# Patient Record
Sex: Male | Born: 1935 | Race: Black or African American | Hispanic: No | Marital: Married | Smoking: Former smoker
Health system: Southern US, Community
[De-identification: ages and names within clinical notes are randomized; demographics above are authoritative.]

## PROBLEM LIST (undated history)

## (undated) DIAGNOSIS — I2510 Atherosclerotic heart disease of native coronary artery without angina pectoris: Secondary | ICD-10-CM

## (undated) DIAGNOSIS — Z955 Presence of coronary angioplasty implant and graft: Secondary | ICD-10-CM

## (undated) DIAGNOSIS — I10 Essential (primary) hypertension: Secondary | ICD-10-CM

## (undated) DIAGNOSIS — F1010 Alcohol abuse, uncomplicated: Secondary | ICD-10-CM

## (undated) DIAGNOSIS — R55 Syncope and collapse: Secondary | ICD-10-CM

## (undated) DIAGNOSIS — C801 Malignant (primary) neoplasm, unspecified: Secondary | ICD-10-CM

## (undated) HISTORY — PX: CORONARY STENT PLACEMENT: SHX1402

## (undated) HISTORY — PX: BACK SURGERY: SHX140

---

## 2017-06-01 ENCOUNTER — Observation Stay (HOSPITAL_COMMUNITY)
Admission: EM | Admit: 2017-06-01 | Discharge: 2017-06-03 | Disposition: A | Discharge status: Home / Self Care | Payer: Medicare HMO | Attending: Internal Medicine | Admitting: Internal Medicine

## 2017-06-01 ENCOUNTER — Encounter (HOSPITAL_COMMUNITY): Payer: Self-pay | Admitting: Neurology

## 2017-06-01 ENCOUNTER — Emergency Department (HOSPITAL_COMMUNITY): Payer: Medicare HMO

## 2017-06-01 DIAGNOSIS — R739 Hyperglycemia, unspecified: Secondary | ICD-10-CM | POA: Diagnosis not present

## 2017-06-01 DIAGNOSIS — Z7902 Long term (current) use of antithrombotics/antiplatelets: Secondary | ICD-10-CM | POA: Diagnosis not present

## 2017-06-01 DIAGNOSIS — I700 Atherosclerosis of aorta: Secondary | ICD-10-CM | POA: Insufficient documentation

## 2017-06-01 DIAGNOSIS — I10 Essential (primary) hypertension: Secondary | ICD-10-CM | POA: Insufficient documentation

## 2017-06-01 DIAGNOSIS — E785 Hyperlipidemia, unspecified: Secondary | ICD-10-CM

## 2017-06-01 DIAGNOSIS — F1010 Alcohol abuse, uncomplicated: Secondary | ICD-10-CM

## 2017-06-01 DIAGNOSIS — I2510 Atherosclerotic heart disease of native coronary artery without angina pectoris: Secondary | ICD-10-CM | POA: Insufficient documentation

## 2017-06-01 DIAGNOSIS — Z87891 Personal history of nicotine dependence: Secondary | ICD-10-CM | POA: Diagnosis not present

## 2017-06-01 DIAGNOSIS — Z955 Presence of coronary angioplasty implant and graft: Secondary | ICD-10-CM | POA: Insufficient documentation

## 2017-06-01 DIAGNOSIS — W19XXXA Unspecified fall, initial encounter: Secondary | ICD-10-CM | POA: Insufficient documentation

## 2017-06-01 DIAGNOSIS — S0003XA Contusion of scalp, initial encounter: Secondary | ICD-10-CM | POA: Insufficient documentation

## 2017-06-01 DIAGNOSIS — Z880 Allergy status to penicillin: Secondary | ICD-10-CM | POA: Insufficient documentation

## 2017-06-01 DIAGNOSIS — N289 Disorder of kidney and ureter, unspecified: Secondary | ICD-10-CM | POA: Diagnosis not present

## 2017-06-01 DIAGNOSIS — R55 Syncope and collapse: Principal | ICD-10-CM | POA: Insufficient documentation

## 2017-06-01 DIAGNOSIS — S0990XA Unspecified injury of head, initial encounter: Secondary | ICD-10-CM

## 2017-06-01 DIAGNOSIS — R509 Fever, unspecified: Secondary | ICD-10-CM | POA: Insufficient documentation

## 2017-06-01 DIAGNOSIS — Z7982 Long term (current) use of aspirin: Secondary | ICD-10-CM | POA: Insufficient documentation

## 2017-06-01 DIAGNOSIS — E871 Hypo-osmolality and hyponatremia: Secondary | ICD-10-CM

## 2017-06-01 DIAGNOSIS — M79641 Pain in right hand: Secondary | ICD-10-CM | POA: Diagnosis not present

## 2017-06-01 DIAGNOSIS — S0512XA Contusion of eyeball and orbital tissues, left eye, initial encounter: Secondary | ICD-10-CM | POA: Insufficient documentation

## 2017-06-01 HISTORY — DX: Syncope and collapse: R55

## 2017-06-01 HISTORY — DX: Essential (primary) hypertension: I10

## 2017-06-01 HISTORY — DX: Atherosclerotic heart disease of native coronary artery without angina pectoris: I25.10

## 2017-06-01 HISTORY — DX: Presence of coronary angioplasty implant and graft: Z95.5

## 2017-06-01 HISTORY — DX: Malignant (primary) neoplasm, unspecified: C80.1

## 2017-06-01 HISTORY — DX: Alcohol abuse, uncomplicated: F10.10

## 2017-06-01 LAB — CBC
HCT: 34.6 % — ABNORMAL LOW (ref 39.0–52.0)
Hemoglobin: 11.3 g/dL — ABNORMAL LOW (ref 13.0–17.0)
MCH: 30.4 pg (ref 26.0–34.0)
MCHC: 32.7 g/dL (ref 30.0–36.0)
MCV: 93 fL (ref 78.0–100.0)
Platelets: 210 10*3/uL (ref 150–400)
RBC: 3.72 MIL/uL — ABNORMAL LOW (ref 4.22–5.81)
RDW: 13.1 % (ref 11.5–15.5)
WBC: 6 10*3/uL (ref 4.0–10.5)

## 2017-06-01 LAB — BASIC METABOLIC PANEL
Anion gap: 8 (ref 5–15)
BUN: 30 mg/dL — AB (ref 6–20)
CHLORIDE: 99 mmol/L — AB (ref 101–111)
CO2: 23 mmol/L (ref 22–32)
Calcium: 8.8 mg/dL — ABNORMAL LOW (ref 8.9–10.3)
Creatinine, Ser: 1.79 mg/dL — ABNORMAL HIGH (ref 0.61–1.24)
GFR calc Af Amer: 39 mL/min — ABNORMAL LOW (ref >60)
GFR calc non Af Amer: 34 mL/min — ABNORMAL LOW (ref >60)
GLUCOSE: 133 mg/dL — AB (ref 65–99)
POTASSIUM: 4.2 mmol/L (ref 3.5–5.1)
Sodium: 130 mmol/L — ABNORMAL LOW (ref 135–145)

## 2017-06-01 LAB — I-STAT TROPONIN, ED: Troponin i, poc: 0.03 ng/mL (ref 0.00–0.08)

## 2017-06-01 LAB — GLUCOSE, CAPILLARY: Glucose-Capillary: 121 mg/dL — ABNORMAL HIGH (ref 65–99)

## 2017-06-01 LAB — URINALYSIS, ROUTINE W REFLEX MICROSCOPIC
BILIRUBIN URINE: NEGATIVE
GLUCOSE, UA: NEGATIVE mg/dL
Hgb urine dipstick: NEGATIVE
KETONES UR: NEGATIVE mg/dL
Leukocytes, UA: NEGATIVE
Nitrite: NEGATIVE
PH: 5 (ref 5.0–8.0)
Protein, ur: NEGATIVE mg/dL
Specific Gravity, Urine: 1.012 (ref 1.005–1.030)

## 2017-06-01 LAB — CBG MONITORING, ED: Glucose-Capillary: 121 mg/dL — ABNORMAL HIGH (ref 65–99)

## 2017-06-01 LAB — TROPONIN I: Troponin I: 0.03 ng/mL (ref <0.03)

## 2017-06-01 MED ORDER — ATORVASTATIN CALCIUM 40 MG PO TABS
40.0000 mg | ORAL_TABLET | Freq: Every day | ORAL | Status: DC
  Administered 2017-06-02 – 2017-06-03 (×2): 40 mg via ORAL
  Filled 2017-06-01 (×2): qty 1

## 2017-06-01 MED ORDER — FERROUS SULFATE 325 (65 FE) MG PO TABS
325.0000 mg | ORAL_TABLET | Freq: Every day | ORAL | Status: DC
Start: 2017-06-02 — End: 2017-06-03
  Administered 2017-06-02 – 2017-06-03 (×2): 325 mg via ORAL
  Filled 2017-06-01 (×2): qty 1

## 2017-06-01 MED ORDER — TICAGRELOR 90 MG PO TABS
90.0000 mg | ORAL_TABLET | Freq: Every day | ORAL | Status: DC
  Administered 2017-06-02 – 2017-06-03 (×2): 90 mg via ORAL
  Filled 2017-06-01 (×2): qty 1

## 2017-06-01 MED ORDER — CHLORTHALIDONE 25 MG PO TABS
25.0000 mg | ORAL_TABLET | Freq: Every day | ORAL | Status: DC
  Administered 2017-06-02: 25 mg via ORAL
  Filled 2017-06-01: qty 1

## 2017-06-01 MED ORDER — ENOXAPARIN SODIUM 40 MG/0.4ML ~~LOC~~ SOLN
40.0000 mg | SUBCUTANEOUS | Status: DC
  Administered 2017-06-02: 40 mg via SUBCUTANEOUS
  Filled 2017-06-01: qty 0.4

## 2017-06-01 MED ORDER — ACETAMINOPHEN 650 MG RE SUPP: 650.0000 mg | Freq: Four times a day (QID) | RECTAL | Status: DC | PRN

## 2017-06-01 MED ORDER — SODIUM CHLORIDE 0.9% FLUSH
3.0000 mL | Freq: Two times a day (BID) | INTRAVENOUS | Status: DC
  Administered 2017-06-01 – 2017-06-02 (×3): 3 mL via INTRAVENOUS

## 2017-06-01 MED ORDER — SODIUM CHLORIDE 0.9 % IV SOLN
INTRAVENOUS | Status: DC
  Administered 2017-06-01: 18:00:00 via INTRAVENOUS

## 2017-06-01 MED ORDER — ASPIRIN EC 81 MG PO TBEC
81.0000 mg | DELAYED_RELEASE_TABLET | Freq: Every day | ORAL | Status: DC
  Administered 2017-06-02 – 2017-06-03 (×2): 81 mg via ORAL
  Filled 2017-06-01 (×2): qty 1

## 2017-06-01 MED ORDER — TETANUS-DIPHTH-ACELL PERTUSSIS 5-2.5-18.5 LF-MCG/0.5 IM SUSP
0.5000 mL | Freq: Once | INTRAMUSCULAR | Status: AC
  Administered 2017-06-01: 0.5 mL via INTRAMUSCULAR
  Filled 2017-06-01: qty 0.5

## 2017-06-01 MED ORDER — ONDANSETRON HCL 4 MG PO TABS: 4.0000 mg | ORAL_TABLET | Freq: Four times a day (QID) | ORAL | Status: DC | PRN

## 2017-06-01 MED ORDER — PANTOPRAZOLE SODIUM 20 MG PO TBEC
20.0000 mg | DELAYED_RELEASE_TABLET | Freq: Every day | ORAL | Status: DC
  Administered 2017-06-02 – 2017-06-03 (×2): 20 mg via ORAL
  Filled 2017-06-01 (×2): qty 1

## 2017-06-01 MED ORDER — ONDANSETRON HCL 4 MG/2ML IJ SOLN: 4.0000 mg | Freq: Four times a day (QID) | INTRAMUSCULAR | Status: DC | PRN

## 2017-06-01 MED ORDER — ACETAMINOPHEN 325 MG PO TABS
650.0000 mg | ORAL_TABLET | Freq: Four times a day (QID) | ORAL | Status: DC | PRN
  Administered 2017-06-01 (×2): 650 mg via ORAL
  Filled 2017-06-01 (×2): qty 2

## 2017-06-01 NOTE — Progress Notes (Signed)
Attending MD note  Patient was seen, examined,treatment plan was discussed with the  Advance Practice Provider.  I have personally reviewed the clinical findings, lab,EKG, imaging studies and management of this patient in detail.I have also reviewed the orders written for this patient which were under my direction. I agree with the documentation, as recorded by the Advance Practice Provider.   Ricardo Jackson is a 83 y.o. male who presents with syncopal episode of unknown etiology. Patient notes a history of 3-4 other syncopal episodes over his lifetime. These all seem to happen under different circumstances. Patient is not appear overly drunk though he does drink a significant amount. This may be contributing though episode seems to be more vagal related versus arrhythmia. Doubt infectious etiology or seizure. Patient will be placed onCIWA protocol and given IV replenishment for his nutritional/metabolic deficiencies. Renal insufficiency noted and patient will follow-up with his primary care physician. CT scan reviewed personally and without acute hemorrhage but does show previous strokes.     Ricardo Jackson, Grayling Congress, MD Family Medicine See Amion for pager # [facility]

## 2017-06-01 NOTE — ED Notes (Signed)
Patient transported to CT 

## 2017-06-01 NOTE — ED Provider Notes (Signed)
[place] DEPT Provider Note   CSN: [id number] Arrival date & time: 06/01/17  5638     History   Chief Complaint Chief Complaint  Patient presents with  . Fall  . Loss of Consciousness    HPI Ricardo Jackson is a 83 y.o. male.  Patient from the [place] area. All his providers are up in that area. He was down here to play golf. Patient does vehicle plague off had syncopal episode felt nausea just prior to it. No vomiting. EMS arrived and he passed out again. Patient denied any other symptoms other than when he passed out the first time he had his for head on the pavement has swelling and bleeding from there. Also some bruising around his left eye. In complaint of pain to his right hand. Tetanus is not up-to-date. Patient denied any shortness of breath chest pain headache being dizzy denies any speech problems any weakness anywhere. Denies any neck or back pain.      Past Medical History:  Diagnosis Date  . Cancer ([place])   . History of heart artery stent   . Hypertension     There are no active problems to display for this patient.   Past Surgical History:  Procedure Laterality Date  . BACK SURGERY         Home Medications    Prior to Admission medications   Not on File    Family History No family history on file.  Social History Social History  Substance Use Topics  . Smoking status: Former Research scientist (life sciences)  . Smokeless tobacco: Never Used  . Alcohol use Yes     Allergies   Penicillins   Review of Systems Review of Systems  Constitutional: Negative for fever.  HENT: Positive for facial swelling. Negative for congestion and trouble swallowing.   Eyes: Negative for photophobia.  Respiratory: Negative for shortness of breath.   Cardiovascular: Negative for chest pain.  Gastrointestinal: Negative for abdominal pain.  Genitourinary: Negative for dysuria.  Musculoskeletal: Negative for back pain and neck pain.  Skin: Positive for wound. Negative for rash.    Neurological: Positive for syncope.  Hematological: Does not bruise/bleed easily.  Psychiatric/Behavioral: Negative for confusion.     Physical Exam Updated Vital Signs BP (!) 143/84 (BP Location: Right Arm)   Pulse 66   Temp 98 F (36.7 C) (Oral)   Resp (!) 24   Ht 1.753 m (5\' 9" )   Wt 70.3 kg (155 lb)   SpO2 97%   BMI 22.89 kg/m   Physical Exam  Constitutional: He is oriented to person, place, and time. He appears well-developed and well-nourished. No distress.  HENT:  Head: Normocephalic.  Mouth/Throat: Oropharynx is clear and moist.  Hematoma to the left for head with about a 1 cm abrasion superficial laceration. Some blood around the left nares. Bruising to the inferior lateral aspect of the left eye soft tissue.  Eyes: Conjunctivae and EOM are normal. Pupils are equal, round, and reactive to light.  Neck: Normal range of motion. Neck supple. No thyromegaly present.  Cardiovascular: Normal rate, regular rhythm and normal heart sounds.   Pulmonary/Chest: Effort normal and breath sounds normal. No respiratory distress.  Abdominal: Soft. Bowel sounds are normal. He exhibits no distension.  Musculoskeletal: Normal range of motion. He exhibits tenderness.  Tenderness and, swelling to the right wrist no snuffbox tenderness.  Neurological: He is alert and oriented to person, place, and time. No cranial nerve deficit or sensory deficit. He exhibits normal muscle tone.  Coordination normal.  Skin: Skin is warm.  Nursing note and vitals reviewed.    ED Treatments / Results  Labs (all labs ordered are listed, but only abnormal results are displayed) Labs Reviewed  BASIC METABOLIC PANEL - Abnormal; Notable for the following:       Result Value   Sodium 130 (*)    Chloride 99 (*)    Glucose, Bld 133 (*)    BUN 30 (*)    Creatinine, Ser 1.79 (*)    Calcium 8.8 (*)    GFR calc non Af Amer 34 (*)    GFR calc Af Amer 39 (*)    All other components within normal limits  CBC -  Abnormal; Notable for the following:    RBC 3.72 (*)    Hemoglobin 11.3 (*)    HCT 34.6 (*)    All other components within normal limits  CBG MONITORING, ED - Abnormal; Notable for the following:    Glucose-Capillary 121 (*)    All other components within normal limits  URINALYSIS, ROUTINE W REFLEX MICROSCOPIC  I-STAT TROPOININ, ED    EKG  EKG Interpretation  Date/Time:  Thursday June 01 2017 09:25:49 EDT Ventricular Rate:  64 PR Interval:    QRS Duration: 95 QT Interval:  390 QTC Calculation: 403 R Axis:   3 Text Interpretation:  Sinus rhythm Posterior infarct, old Probable anteroseptal infarct, old Nonspecific T abnormalities, lateral leads Baseline wander in lead(s) V1 No previous ECGs available Confirmed by Fredia Sorrow [phone]) on 06/01/2017 10:10:22 AM       Radiology Dg Chest 2 View  Result Date: 06/01/2017 CLINICAL DATA:  Syncope. EXAM: CHEST  2 VIEW COMPARISON:  None. FINDINGS: Normal heart size. Negative atheromatous aortic contours. Coronary stent noted. Mild elevation the left diaphragm. There is no edema, consolidation, effusion, or pneumothorax. Nipple shadow at the right base, also seen laterally. Spondylosis. No acute osseous finding. IMPRESSION: No acute finding. Mild elevation of the left diaphragm. Electronically Signed   By: Monte Fantasia M.D.   On: 06/01/2017 10:49   Ct Head Wo Contrast  Result Date: 06/01/2017 CLINICAL DATA:  Syncope with fall EXAM: CT HEAD WITHOUT CONTRAST TECHNIQUE: Contiguous axial images were obtained from the base of the skull through the vertex without intravenous contrast. COMPARISON:  None. FINDINGS: Brain: There is age related volume loss. There is no intracranial mass, hemorrhage, extra-axial fluid collection, or midline shift. There is evidence of a prior small infarct in the superior left cerebellum. There is slight small vessel disease in the centra semiovale bilaterally. Elsewhere gray-white compartments appear normal. No  acute infarct is demonstrable on this study. Vascular: There is no hyperdense vessel. There is calcification in each carotid siphon region. There is also calcification in each distal vertebral artery. Skull: The bony calvarium appears intact. There is a left frontal scalp hematoma. Sinuses/Orbits: There is mucosal thickening in several ethmoid air cells bilaterally. Orbits appear symmetric bilaterally. Patient appears to have had cataract extractions bilaterally. Other: Mastoid air cells are clear. 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 Minimal periventricular small vessel disease. No intracranial mass, hemorrhage, or extra-axial fluid collection. Left frontal scalp hematoma without fracture. Mild paranasal sinus disease in ethmoid regions. Multiple foci of atherosclerotic vascular calcification. Electronically Signed   By: Lowella Grip III M.D.   On: 06/01/2017 11:48   Dg Hand Complete Right  Result Date: 06/01/2017 CLINICAL DATA:  Right hand pain after fall today. EXAM: RIGHT HAND - COMPLETE 3+ VIEW  COMPARISON:  None. FINDINGS: There is no evidence of fracture or dislocation. Probable degenerative changes seen involving the second metacarpal phalangeal joint. Soft tissues are unremarkable. IMPRESSION: No acute abnormality seen in the right hand. Electronically Signed   By: Marijo Conception, M.D.   On: 06/01/2017 10:50    Procedures Procedures (including critical care time)  Medications Ordered in ED Medications  Tdap (BOOSTRIX) injection 0.5 mL (0.5 mLs Intramuscular Given 06/01/17 1106)     Initial Impression / Assessment and Plan / ED Course  I have reviewed the triage vital signs and the nursing notes.  Pertinent labs & imaging results that were available during my care of the patient were reviewed by me and considered in my medical decision making (see chart for details).     [place] and had 2 syncopal episodes.  With one he fell and hit his head and face. Has a hematoma to the left for head area with abrasion. Head CT negative for any acute findings. Shows evidence of a remote infarct. Patient stated that right before passing out he felt nauseated. Denied any pain.  Patient's labs show some mild mild hyponatremia. And mild renal insufficiency.  Patient had no chest pain.  Cardiac monitoring here showed no arrhythmias.  Patient will require admission for the syncopal episode and cardiac monitoring. Follow-up on the sodium.  Tetanus provided here today the abrasion/superficial laceration on the forehead will require just local wound care.  Final Clinical Impressions(s) / ED Diagnoses   Final diagnoses:  Syncope and collapse  Injury of head, initial encounter    New Prescriptions New Prescriptions   No medications on file     Fredia Sorrow, MD 06/01/17 1206

## 2017-06-01 NOTE — Progress Notes (Addendum)
Patient states that sons and daughters are allowed to receive information regarding health care. Patient declines to set up password at this time.

## 2017-06-01 NOTE — ED Notes (Signed)
Patient transported to X-ray 

## 2017-06-01 NOTE — H&P (Addendum)
History and Physical    Cesare Sumlin PPI:[id number] DOB: 01-22-1933 DOA: 06/01/2017  Referring MD/NP/PA: Rogene Houston PCP: Kendrick Ranch, MD  Outpatient Specialists: Patient coming from: home  Chief Complaint: Syncope, contusion head  HPI: Ricardo Jackson is a 83 y.o. male with medical history significant of hypertension, CAD with a stent.  Mr. Roxy Manns was visiting [place] from [place] to play golf today.  Pt reports he began feeling nauseated and opened car door.  Pt reports he passed out and hit his head.  EMS reported pt passed out again while they were evaluating him.  Pt complains of soreness to his forehead.  Pt denies any other areas of injury.  Pt reports he feels normal now.  Pt denies any current nausea.  Pt reports he was feeling well before episode.  No chest pain, no headache.  ED Course: Pt seen in the ED.  Ct head shows no acute abnormality prior small infarct in superior left cerebellum.  Hand and chest xray negative   Review of Systems: As per HPI otherwise 10 point review of systems negative.     Past Medical History:  Diagnosis Date  . Cancer ([place])   . History of heart artery stent   . Hypertension     Past Surgical History:  Procedure Laterality Date  . BACK SURGERY       reports that he has quit smoking. He has never used smokeless tobacco. He reports that he drinks alcohol. His drug history is not on file.  Allergies  Allergen Reactions  . Penicillins Hives    Has patient had a PCN reaction causing immediate rash, facial/tongue/throat swelling, SOB or lightheadedness with hypotension: Yes Has patient had a PCN reaction causing severe rash involving mucus membranes or skin necrosis: No Has patient had a PCN reaction that required hospitalization: No Has patient had a PCN reaction occurring within the last 10 years: No If all of the above answers are "NO", then may proceed with Cephalosporin use.     Family History  Problem Relation Age of  Onset  . Heart disease Father    Cousins diabetes  Prior to Admission medications   Medication Sig Start Date End Date Taking? Authorizing Provider  aspirin EC 81 MG tablet Take 81 mg by mouth daily.   Yes [provider]  atorvastatin (LIPITOR) 40 MG tablet Take 40 mg by mouth daily. 04/05/17  Yes [provider]  BRILINTA 90 MG TABS tablet Take 90 mg by mouth daily. 04/19/17  Yes [provider]  chlorthalidone (HYGROTON) 25 MG tablet Take 25 mg by mouth daily. 05/25/17  Yes [provider]  ferrous sulfate 325 (65 FE) MG tablet Take 325 mg by mouth daily with breakfast.   Yes [provider]  lansoprazole (PREVACID) 30 MG capsule Take 30 mg by mouth daily. 05/23/17  Yes [provider]  LEVITRA 20 MG tablet Take 20 mg by mouth daily as needed for erectile dysfunction.  05/03/17  Yes [provider]  metoprolol tartrate (LOPRESSOR) 25 MG tablet Take 25 mg by mouth daily. 03/21/17  Yes [provider]  ULORIC 80 MG TABS Take 80 mg by mouth daily. 05/23/17  Yes [provider]  valsartan (DIOVAN) 320 MG tablet Take 320 mg by mouth daily. 05/11/17  Yes [provider]  Vitamin D, Ergocalciferol, (DRISDOL) 50000 units CAPS capsule Take 50,000 Units by mouth every 7 (seven) days. THURSDAYS 03/31/17  Yes [provider]    Physical Exam: Vitals:  06/01/17 1230 06/01/17 1406 06/01/17 1449 06/01/17 1632  BP: (!) 157/79 (!) 159/96 (!) 145/66 (!) 156/77  Pulse: (!) 57 81 78 80  Resp: 14 18 18    Temp:   98 F (36.7 C) 97.9 F (36.6 C)  TempSrc:   Oral Oral  SpO2: 96% 98% 100% 95%  Weight:   64.9 kg (143 lb 1.6 oz)   Height:   5\' 9"  (1.753 m)       Constitutional: NAD, calm, comfortable Vitals:   06/01/17 1230 06/01/17 1406 06/01/17 1449 06/01/17 1632  BP: (!) 157/79 (!) 159/96 (!) 145/66 (!) 156/77  Pulse: (!) 57 81 78 80  Resp: 14 18 18    Temp:   98 F (36.7 C) 97.9 F (36.6 C)  TempSrc:   Oral  Oral  SpO2: 96% 98% 100% 95%  Weight:   64.9 kg (143 lb 1.6 oz)   Height:   5\' 9"  (1.753 m)    Eyes: PERRL, lids and conjunctivae normal ENMT: Mucous membranes are moist. Posterior pharynx clear of any exudate or lesions.Normal dentition.  Neck: normal, supple, no masses, no thyromegaly Respiratory: clear to auscultation bilaterally, no wheezing, no crackles. Normal respiratory effort. No accessory muscle use.  Cardiovascular: Regular rate and rhythm, no murmurs / rubs / gallops. No extremity edema. 2+ pedal pulses. No carotid bruits.  Abdomen: no tenderness, no masses palpated. No hepatosplenomegaly. Bowel sounds positive.  Musculoskeletal: no clubbing / cyanosis. No joint deformity upper and lower extremities. Good ROM, no contractures. Normal muscle tone.  Skin: no rashes, lesions, ulcers. No induration forehead swollen area mid forehead,  Neurologic: CN 2-12 grossly intact. Sensation intact, DTR normal. Strength 5/5 in all 4.  Psychiatric  Pleasant, good eye contact    Labs on Admission: I have personally reviewed following labs and imaging studies  CBC:  Recent Labs Lab 06/01/17 0942  WBC 6.0  HGB 11.3*  HCT 34.6*  MCV 93.0  PLT 295   Basic Metabolic Panel:  Recent Labs Lab 06/01/17 0942  NA 130*  K 4.2  CL 99*  CO2 23  GLUCOSE 133*  BUN 30*  CREATININE 1.79*  CALCIUM 8.8*   GFR: Estimated Creatinine Clearance: 29.7 mL/min (A) (by C-G formula based on SCr of 1.79 mg/dL (H)). Liver Function Tests: No results for input(s): AST, ALT, ALKPHOS, BILITOT, PROT, ALBUMIN in the last 168 hours. No results for input(s): LIPASE, AMYLASE in the last 168 hours. No results for input(s): AMMONIA in the last 168 hours. Coagulation Profile: No results for input(s): INR, PROTIME in the last 168 hours. Cardiac Enzymes:  Recent Labs Lab 06/01/17 1559  TROPONINI 0.03*   BNP (last 3 results) No results for input(s): PROBNP in the last 8760 hours. HbA1C: No results for  input(s): HGBA1C in the last 72 hours. CBG:  Recent Labs Lab 06/01/17 0948  GLUCAP 121*   Lipid Profile: No results for input(s): CHOL, HDL, LDLCALC, TRIG, CHOLHDL, LDLDIRECT in the last 72 hours. Thyroid Function Tests: No results for input(s): TSH, T4TOTAL, FREET4, T3FREE, THYROIDAB in the last 72 hours. Anemia Panel: No results for input(s): VITAMINB12, FOLATE, FERRITIN, TIBC, IRON, RETICCTPCT in the last 72 hours. Urine analysis:    Component Value Date/Time   COLORURINE YELLOW 06/01/2017 1100   APPEARANCEUR CLEAR 06/01/2017 1100   LABSPEC 1.012 06/01/2017 1100   PHURINE 5.0 06/01/2017 1100   GLUCOSEU NEGATIVE 06/01/2017 1100   HGBUR NEGATIVE 06/01/2017 1100   BILIRUBINUR NEGATIVE 06/01/2017 1100   KETONESUR NEGATIVE 06/01/2017 1100  PROTEINUR NEGATIVE 06/01/2017 1100   NITRITE NEGATIVE 06/01/2017 1100   LEUKOCYTESUR NEGATIVE 06/01/2017 1100   Sepsis Labs: @LABRCNTIP (procalcitonin:4,lacticidven:4) )No results found for this or any previous visit (from the past 240 hour(s)).   Radiological Exams on Admission: Dg Chest 2 View  Result Date: 06/01/2017 CLINICAL DATA:  Syncope. EXAM: CHEST  2 VIEW COMPARISON:  None. FINDINGS: Normal heart size. Negative atheromatous aortic contours. Coronary stent noted. Mild elevation the left diaphragm. There is no edema, consolidation, effusion, or pneumothorax. Nipple shadow at the right base, also seen laterally. Spondylosis. No acute osseous finding. IMPRESSION: No acute finding. Mild elevation of the left diaphragm. Electronically Signed   By: Monte Fantasia M.D.   On: 06/01/2017 10:49   Ct Head Wo Contrast  Result Date: 06/01/2017 CLINICAL DATA:  Syncope with fall EXAM: CT HEAD WITHOUT CONTRAST TECHNIQUE: Contiguous axial images were obtained from the base of the skull through the vertex without intravenous contrast. COMPARISON:  None. FINDINGS: Brain: There is age related volume loss. There is no intracranial mass, hemorrhage,  extra-axial fluid collection, or midline shift. There is evidence of a prior small infarct in the superior left cerebellum. There is slight small vessel disease in the centra semiovale bilaterally. Elsewhere gray-white compartments appear normal. No acute infarct is demonstrable on this study. Vascular: There is no hyperdense vessel. There is calcification in each carotid siphon region. There is also calcification in each distal vertebral artery. Skull: The bony calvarium appears intact. There is a left frontal scalp hematoma. Sinuses/Orbits: There is mucosal thickening in several ethmoid air cells bilaterally. Orbits appear symmetric bilaterally. Patient appears to have had cataract extractions bilaterally. Other: Mastoid air cells are clear. 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 Minimal periventricular small vessel disease. No intracranial mass, hemorrhage, or extra-axial fluid collection. Left frontal scalp hematoma without fracture. Mild paranasal sinus disease in ethmoid regions. Multiple foci of atherosclerotic vascular calcification. Electronically Signed   By: Lowella Grip III M.D.   On: 06/01/2017 11:48   Dg Hand Complete Right  Result Date: 06/01/2017 CLINICAL DATA:  Right hand pain after fall today. EXAM: RIGHT HAND - COMPLETE 3+ VIEW COMPARISON:  None. FINDINGS: There is no evidence of fracture or dislocation. Probable degenerative changes seen involving the second metacarpal phalangeal joint. Soft tissues are unremarkable. IMPRESSION: No acute abnormality seen in the right hand. Electronically Signed   By: Marijo Conception, M.D.   On: 06/01/2017 10:50    EKG: Independently reviewed  Assessment/Plan Principal Problem:   Syncope Active Problems:   Hypertension   Hyperlipidemia   CAD (coronary artery disease)   Hyponatremia   Renal insufficiency   ETOH abuse     Syncope: Unclear etiology. Arrhythmia versus vasovagal episode versus ETOH use vs aortic  stenosis.  - Tele - Echo - syncope orderset  HTN:  - continue home regimen  ETOH use: Pt drinks multiple alcoholic drinks at baseline w/ drink of choice being brandy. Currently not showing any signs of withdrawal.  - CIWA  Renal insufficency: Cr 1.79. No previous labs to compare - IVF - BMET in am  Hyponatremia: 130. Likely from ETOH use - IVF - BMET in am  Hyperglycemia: 133 on admission. No h/o DM - CBG AC - A1c    DVT prophylaxis: lovenox Code Status: Full Family Communication: no family present Disposition Plan: 6/15 Consults called:  Admission status: Observation   Marcene Brawn PA-C [facility] Pager [phone] If 7PM-7AM, please contact night-coverage [email or web address] Password [email or web address]  06/01/2017, 5:20 PM     Attending MD note  Patient was seen, examined,treatment plan was discussed with the  Advance Practice Provider.  I have personally reviewed the clinical findings, lab,EKG, imaging studies and management of this patient in detail.I have also reviewed the orders written for this patient which were under my direction. I agree with the documentation, as recorded by the Advance Practice Provider.   Ricardo Jackson is a 83 y.o. male who presents with syncopal episode of unknown etiology. Patient notes a history of 3-4 other syncopal episodes over his lifetime. These all seem to happen under different circumstances. Patient is not appear overly drunk though he does drink a significant amount. This may be contributing though episode seems to be more vagal related versus arrhythmia. Doubt infectious etiology or seizure. Patient will be placed on CIWA protocol and given IV replenishment for his nutritional/metabolic deficiencies. Renal insufficiency noted and patient will follow-up with his primary care physician. CT scan reviewed personally and without acute hemorrhage but does show previous strokes. Echo ordered for completeness. While less likely aortic stenosis  may be contributing.      Hilary Pundt, Grayling Congress, MD Family Medicine See Amion for pager # [facility]

## 2017-06-01 NOTE — ED Triage Notes (Signed)
[place] car , pt had pos loc , pt passed out a second time in front of ems

## 2017-06-02 ENCOUNTER — Ambulatory Visit (HOSPITAL_BASED_OUTPATIENT_CLINIC_OR_DEPARTMENT_OTHER): Payer: Medicare HMO

## 2017-06-02 ENCOUNTER — Ambulatory Visit (HOSPITAL_COMMUNITY): Payer: Medicare HMO

## 2017-06-02 ENCOUNTER — Observation Stay (HOSPITAL_COMMUNITY): Payer: Medicare HMO

## 2017-06-02 DIAGNOSIS — I2510 Atherosclerotic heart disease of native coronary artery without angina pectoris: Secondary | ICD-10-CM | POA: Diagnosis not present

## 2017-06-02 DIAGNOSIS — R55 Syncope and collapse: Secondary | ICD-10-CM | POA: Diagnosis not present

## 2017-06-02 DIAGNOSIS — F1010 Alcohol abuse, uncomplicated: Secondary | ICD-10-CM | POA: Diagnosis not present

## 2017-06-02 LAB — CBC
HCT: 33.9 % — ABNORMAL LOW (ref 39.0–52.0)
Hemoglobin: 11 g/dL — ABNORMAL LOW (ref 13.0–17.0)
MCH: 30.2 pg (ref 26.0–34.0)
MCHC: 32.4 g/dL (ref 30.0–36.0)
MCV: 93.1 fL (ref 78.0–100.0)
Platelets: 213 10*3/uL (ref 150–400)
RBC: 3.64 MIL/uL — ABNORMAL LOW (ref 4.22–5.81)
RDW: 13.2 % (ref 11.5–15.5)
WBC: 5.2 10*3/uL (ref 4.0–10.5)

## 2017-06-02 LAB — BASIC METABOLIC PANEL
Anion gap: 7 (ref 5–15)
BUN: 27 mg/dL — AB (ref 6–20)
CHLORIDE: 101 mmol/L (ref 101–111)
CO2: 24 mmol/L (ref 22–32)
CREATININE: 1.67 mg/dL — AB (ref 0.61–1.24)
Calcium: 8.7 mg/dL — ABNORMAL LOW (ref 8.9–10.3)
GFR calc Af Amer: 43 mL/min — ABNORMAL LOW (ref >60)
GFR calc non Af Amer: 37 mL/min — ABNORMAL LOW (ref >60)
GLUCOSE: 107 mg/dL — AB (ref 65–99)
Potassium: 3.9 mmol/L (ref 3.5–5.1)
SODIUM: 132 mmol/L — AB (ref 135–145)

## 2017-06-02 LAB — GLUCOSE, CAPILLARY
GLUCOSE-CAPILLARY: 106 mg/dL — AB (ref 65–99)
Glucose-Capillary: 101 mg/dL — ABNORMAL HIGH (ref 65–99)
Glucose-Capillary: 122 mg/dL — ABNORMAL HIGH (ref 65–99)

## 2017-06-02 LAB — VAS US CAROTID
LCCADSYS: -90 cm/s
LCCAPDIAS: 25 cm/s
LCCAPSYS: 161 cm/s
LEFT ECA DIAS: -12 cm/s
LEFT VERTEBRAL DIAS: -12 cm/s
Left CCA dist dias: -29 cm/s
Left ICA dist dias: -35 cm/s
Left ICA dist sys: -125 cm/s
Left ICA prox dias: -54 cm/s
Left ICA prox sys: -162 cm/s
RCCAPDIAS: 0 cm/s
RIGHT ECA DIAS: -2 cm/s
RIGHT VERTEBRAL DIAS: 3 cm/s
Right CCA prox sys: 96 cm/s

## 2017-06-02 LAB — ECHOCARDIOGRAM COMPLETE
Height: 69 in
WEIGHTICAEL: 2328 [oz_av]

## 2017-06-02 MED ORDER — VALSARTAN 320 MG PO TABS: 320.0000 mg | ORAL_TABLET | Freq: Every day | ORAL | 0 refills | Status: AC

## 2017-06-02 MED ORDER — HYDRALAZINE HCL 20 MG/ML IJ SOLN
5.0000 mg | INTRAMUSCULAR | Status: DC | PRN
  Administered 2017-06-02: 5 mg via INTRAVENOUS
  Filled 2017-06-02: qty 1

## 2017-06-02 MED ORDER — AMLODIPINE BESYLATE 5 MG PO TABS
5.0000 mg | ORAL_TABLET | Freq: Every day | ORAL | Status: DC
  Administered 2017-06-02 – 2017-06-03 (×2): 5 mg via ORAL
  Filled 2017-06-02 (×2): qty 1

## 2017-06-02 MED ORDER — CHLORTHALIDONE 25 MG PO TABS: 25.0000 mg | ORAL_TABLET | Freq: Every day | ORAL | 0 refills | Status: AC

## 2017-06-02 MED ORDER — SODIUM CHLORIDE 0.9 % IV BOLUS (SEPSIS)
250.0000 mL | Freq: Once | INTRAVENOUS | Status: AC
  Administered 2017-06-02: 250 mL via INTRAVENOUS

## 2017-06-02 NOTE — Progress Notes (Signed)
Pt is alert and oriented with no distress encouraged pt to walk, he declined and wants to go home, waiting for doppler studies to be read.

## 2017-06-02 NOTE — Discharge Summary (Signed)
Physician Discharge Summary  Ricardo Jackson MRN: [id number] DOB/AGE: 1933/08/22 83 y.o.  PCP: Kendrick Ranch, MD   Admit date: 06/01/2017 Discharge date: 06/02/2017  Discharge Diagnoses:    Principal Problem:   Syncope Active Problems:   Hypertension   Hyperlipidemia   CAD (coronary artery disease)   Hyponatremia   Renal insufficiency   ETOH abuse  Addendum Dc cancelled as results of echo unavailable today, has not been read    Follow-up recommendations Follow-up with PCP in 3-5 days , including all  additional recommended appointments as below Follow-up CBC, CMP in 3-5 days Patient recommended to hold chlorthalidone and Diovan until next week, pending improvement in his renal function, and PCP follow-up Patient instructed not to drive back home alone, in the presence of RN. He voiced understanding     Current Discharge Medication List    CONTINUE these medications which have CHANGED   Details  chlorthalidone (HYGROTON) 25 MG tablet Take 1 tablet (25 mg total) by mouth daily. Qty: 30 tablet, Refills: 0    valsartan (DIOVAN) 320 MG tablet Take 1 tablet (320 mg total) by mouth daily. Qty: 30 tablet, Refills: 0      CONTINUE these medications which have NOT CHANGED   Details  aspirin EC 81 MG tablet Take 81 mg by mouth daily.    atorvastatin (LIPITOR) 40 MG tablet Take 40 mg by mouth daily.    BRILINTA 90 MG TABS tablet Take 90 mg by mouth daily.    ferrous sulfate 325 (65 FE) MG tablet Take 325 mg by mouth daily with breakfast.    lansoprazole (PREVACID) 30 MG capsule Take 30 mg by mouth daily.    LEVITRA 20 MG tablet Take 20 mg by mouth daily as needed for erectile dysfunction.     metoprolol tartrate (LOPRESSOR) 25 MG tablet Take 25 mg by mouth daily.    ULORIC 80 MG TABS Take 80 mg by mouth daily.    Vitamin D, Ergocalciferol, (DRISDOL) 50000 units CAPS capsule Take 50,000 Units by mouth every 7 (seven) days. [place]         Discharge  Condition: Stable   Discharge Instructions Get Medicines reviewed and adjusted: Please take all your medications with you for your next visit with your Primary MD  Please request your Primary MD to go over all hospital tests and procedure/radiological results at the follow up, please ask your Primary MD to get all Hospital records sent to his/her office.  If you experience worsening of your admission symptoms, develop shortness of breath, life threatening emergency, suicidal or homicidal thoughts you must seek medical attention immediately by calling 911 or calling your MD immediately if symptoms less severe.  You must read complete instructions/literature along with all the possible adverse reactions/side effects for all the Medicines you take and that have been prescribed to you. Take any new Medicines after you have completely understood and accpet all the possible adverse reactions/side effects.   Do not drive when taking Pain medications.   Do not take more than prescribed Pain, Sleep and Anxiety Medications  Special Instructions: If you have smoked or chewed Tobacco in the last 2 yrs please stop smoking, stop any regular Alcohol and or any Recreational drug use.  Wear Seat belts while driving.  Please note  You were cared for by a hospitalist during your hospital stay. Once you are discharged, your primary care physician will handle any further medical issues. Please note that NO REFILLS for any discharge medications will  be authorized once you are discharged, as it is imperative that you return to your primary care physician (or establish a relationship with a primary care physician if you do not have one) for your aftercare needs so that they can reassess your need for medications and monitor your lab values.     Allergies  Allergen Reactions  . Penicillins Hives    Has patient had a PCN reaction causing immediate rash, facial/tongue/throat swelling, SOB or lightheadedness  with hypotension: Yes Has patient had a PCN reaction causing severe rash involving mucus membranes or skin necrosis: No Has patient had a PCN reaction that required hospitalization: No Has patient had a PCN reaction occurring within the last 10 years: No If all of the above answers are "NO", then may proceed with Cephalosporin use.       Disposition: Home   Consults:  None    Significant Diagnostic Studies:  Dg Chest 2 View  Result Date: 06/01/2017 CLINICAL DATA:  Syncope. EXAM: CHEST  2 VIEW COMPARISON:  None. FINDINGS: Normal heart size. Negative atheromatous aortic contours. Coronary stent noted. Mild elevation the left diaphragm. There is no edema, consolidation, effusion, or pneumothorax. Nipple shadow at the right base, also seen laterally. Spondylosis. No acute osseous finding. IMPRESSION: No acute finding. Mild elevation of the left diaphragm. Electronically Signed   By: Monte Fantasia M.D.   On: 06/01/2017 10:49   Ct Head Wo Contrast  Result Date: 06/01/2017 CLINICAL DATA:  Syncope with fall EXAM: CT HEAD WITHOUT CONTRAST TECHNIQUE: Contiguous axial images were obtained from the base of the skull through the vertex without intravenous contrast. COMPARISON:  None. FINDINGS: Brain: There is age related volume loss. There is no intracranial mass, hemorrhage, extra-axial fluid collection, or midline shift. There is evidence of a prior small infarct in the superior left cerebellum. There is slight small vessel disease in the centra semiovale bilaterally. Elsewhere gray-white compartments appear normal. No acute infarct is demonstrable on this study. Vascular: There is no hyperdense vessel. There is calcification in each carotid siphon region. There is also calcification in each distal vertebral artery. Skull: The bony calvarium appears intact. There is a left frontal scalp hematoma. Sinuses/Orbits: There is mucosal thickening in several ethmoid air cells bilaterally. Orbits appear  symmetric bilaterally. Patient appears to have had cataract extractions bilaterally. Other: Mastoid air cells are clear. 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 Minimal periventricular small vessel disease. No intracranial mass, hemorrhage, or extra-axial fluid collection. Left frontal scalp hematoma without fracture. Mild paranasal sinus disease in ethmoid regions. Multiple foci of atherosclerotic vascular calcification. Electronically Signed   By: Lowella Grip III M.D.   On: 06/01/2017 11:48   Dg Hand Complete Right  Result Date: 06/01/2017 CLINICAL DATA:  Right hand pain after fall today. EXAM: RIGHT HAND - COMPLETE 3+ VIEW COMPARISON:  None. FINDINGS: There is no evidence of fracture or dislocation. Probable degenerative changes seen involving the second metacarpal phalangeal joint. Soft tissues are unremarkable. IMPRESSION: No acute abnormality seen in the right hand. Electronically Signed   By: Marijo Conception, M.D.   On: 06/01/2017 10:50    echocardiogram       Filed Weights   06/01/17 0938 06/01/17 1449 06/02/17 0500  Weight: 70.3 kg (155 lb) 64.9 kg (143 lb 1.6 oz) 66 kg (145 lb 8 oz)     Microbiology: No results found for this or any previous visit (from the past 240 hour(s)).     Blood Culture No  results found for: SDES, [place], [place], REPTSTATUS    Labs: Results for orders placed or performed during the hospital encounter of 06/01/17 (from the past 48 hour(s))  Basic metabolic panel     Status: Abnormal   Collection Time: 06/01/17  9:42 AM  Result Value Ref Range   Sodium 130 (L) 135 - 145 mmol/L   Potassium 4.2 3.5 - 5.1 mmol/L   Chloride 99 (L) 101 - 111 mmol/L   CO2 23 22 - 32 mmol/L   Glucose, Bld 133 (H) 65 - 99 mg/dL   BUN 30 (H) 6 - 20 mg/dL   Creatinine, Ser 1.79 (H) 0.61 - 1.24 mg/dL   Calcium 8.8 (L) 8.9 - 10.3 mg/dL   GFR calc non Af Amer 34 (L) >60 mL/min   GFR calc Af Amer 39 (L) >60 mL/min    Comment: (NOTE) The  eGFR has been calculated using the CKD EPI equation. This calculation has not been validated in all clinical situations. eGFR's persistently <60 mL/min signify possible Chronic Kidney Disease.    Anion gap 8 5 - 15  CBC     Status: Abnormal   Collection Time: 06/01/17  9:42 AM  Result Value Ref Range   WBC 6.0 4.0 - 10.5 K/uL   RBC 3.72 (L) 4.22 - 5.81 MIL/uL   Hemoglobin 11.3 (L) 13.0 - 17.0 g/dL   HCT 34.6 (L) 39.0 - 52.0 %   MCV 93.0 78.0 - 100.0 fL   MCH 30.4 26.0 - 34.0 pg   MCHC 32.7 30.0 - 36.0 g/dL   RDW 13.1 11.5 - 15.5 %   Platelets 210 150 - 400 K/uL  CBG monitoring, ED     Status: Abnormal   Collection Time: 06/01/17  9:48 AM  Result Value Ref Range   Glucose-Capillary 121 (H) 65 - 99 mg/dL  I-Stat Troponin, ED (not at [facility])     Status: None   Collection Time: 06/01/17  9:49 AM  Result Value Ref Range   Troponin i, poc 0.03 0.00 - 0.08 ng/mL   Comment 3            Comment: Due to the release kinetics of cTnI, a negative result within the first hours of the onset of symptoms does not rule out myocardial infarction with certainty. If myocardial infarction is still suspected, repeat the test at appropriate intervals.   Urinalysis, Routine w reflex microscopic     Status: None   Collection Time: 06/01/17 11:00 AM  Result Value Ref Range   Color, Urine YELLOW YELLOW   APPearance CLEAR CLEAR   Specific Gravity, Urine 1.012 1.005 - 1.030   pH 5.0 5.0 - 8.0   Glucose, UA NEGATIVE NEGATIVE mg/dL   Hgb urine dipstick NEGATIVE NEGATIVE   Bilirubin Urine NEGATIVE NEGATIVE   Ketones, ur NEGATIVE NEGATIVE mg/dL   Protein, ur NEGATIVE NEGATIVE mg/dL   Nitrite NEGATIVE NEGATIVE   Leukocytes, UA NEGATIVE NEGATIVE  Troponin I     Status: Abnormal   Collection Time: 06/01/17  3:59 PM  Result Value Ref Range   Troponin I 0.03 (HH) <0.03 ng/mL    Comment: CRITICAL RESULT CALLED TO, READ BACK BY AND VERIFIED WITHChristiane Ha RN [phone] GREEN R   Glucose, capillary      Status: Abnormal   Collection Time: 06/01/17  8:54 PM  Result Value Ref Range   Glucose-Capillary 121 (H) 65 - 99 mg/dL   Comment 1 Notify RN    Comment 2 Document  in Chart   Basic metabolic panel     Status: Abnormal   Collection Time: 06/02/17  3:40 AM  Result Value Ref Range   Sodium 132 (L) 135 - 145 mmol/L   Potassium 3.9 3.5 - 5.1 mmol/L   Chloride 101 101 - 111 mmol/L   CO2 24 22 - 32 mmol/L   Glucose, Bld 107 (H) 65 - 99 mg/dL   BUN 27 (H) 6 - 20 mg/dL   Creatinine, Ser 1.67 (H) 0.61 - 1.24 mg/dL   Calcium 8.7 (L) 8.9 - 10.3 mg/dL   GFR calc non Af Amer 37 (L) >60 mL/min   GFR calc Af Amer 43 (L) >60 mL/min    Comment: (NOTE) The eGFR has been calculated using the CKD EPI equation. This calculation has not been validated in all clinical situations. eGFR's persistently <60 mL/min signify possible Chronic Kidney Disease.    Anion gap 7 5 - 15  CBC     Status: Abnormal   Collection Time: 06/02/17  3:40 AM  Result Value Ref Range   WBC 5.2 4.0 - 10.5 K/uL   RBC 3.64 (L) 4.22 - 5.81 MIL/uL   Hemoglobin 11.0 (L) 13.0 - 17.0 g/dL   HCT 33.9 (L) 39.0 - 52.0 %   MCV 93.1 78.0 - 100.0 fL   MCH 30.2 26.0 - 34.0 pg   MCHC 32.4 30.0 - 36.0 g/dL   RDW 13.2 11.5 - 15.5 %   Platelets 213 150 - 400 K/uL  Glucose, capillary     Status: Abnormal   Collection Time: 06/02/17  6:04 AM  Result Value Ref Range   Glucose-Capillary 101 (H) 65 - 99 mg/dL   Comment 1 Notify RN    Comment 2 Document in Chart   Glucose, capillary     Status: Abnormal   Collection Time: 06/02/17 11:23 AM  Result Value Ref Range   Glucose-Capillary 122 (H) 65 - 99 mg/dL   Comment 1 Notify RN   Glucose, capillary     Status: Abnormal   Collection Time: 06/02/17  4:25 PM  Result Value Ref Range   Glucose-Capillary 106 (H) 65 - 99 mg/dL   Comment 1 Notify RN      Lipid Panel  No results found for: CHOL, TRIG, HDL, CHOLHDL, VLDL, LDLCALC, LDLDIRECT   No results found for: HGBA1C   Lab Results   Component Value Date   CREATININE 1.67 (H) 06/02/2017     HPI :  Ricardo Jackson is a 83 y.o. male with medical history significant of hypertension, CAD with a stent.  Mr. Ricardo Jackson was visiting [place] from [place] to play golf today.  Pt reports he began feeling nauseated and opened car door.  Pt reports he passed out and hit his head.  EMS reported pt passed out again while they were evaluating him.  Pt complains of soreness to his forehead.   ED Course: Pt seen in the ED.  Ct head shows no acute abnormality prior small infarct in superior left cerebellum.  Hand and chest xray negative   HOSPITAL COURSE: Syncope Patient admitted for syncope workup-thought to be vasovagal in etiology Telemetry strip showed sinus rhythm Initial creatinine around 1.79, sodium was found to be 130. 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 Evidence of 40-59% stenosis Left ICA. Echo results pending  Fever Low grade fever, UA negative Patient denied any focal symptoms, repeat chest x-ray was negative  HTN Started on prn hydralazine as inpatient for sbp>160 Continue metoprolol Also started on norvasc pending improvement in electrolytes    Discharge Exam:   Blood pressure (!) 146/77, pulse 61, temperature 100.2 F (37.9 C), temperature source Oral, resp. rate 18, height '5\' 9"'  (1.753 m), weight 66 kg (145 lb 8 oz), SpO2 100 %. Cardiovascular: Regular rate and rhythm, no murmurs / rubs / gallops. No extremity edema. 2+ pedal pulses. No carotid bruits.   Abdomen: no tenderness, no masses palpated. No hepatosplenomegaly. Bowel sounds positive.  Musculoskeletal: no clubbing / cyanosis. No joint deformity upper and lower extremities. Good ROM, no contractures. Normal muscle tone.  Skin: no rashes, lesions, ulcers. No induration forehead swollen area mid forehead,  Neurologic: CN 2-12 grossly intact. Sensation intact, DTR normal. Strength 5/5 in all 4.        SignedReyne Dumas 06/02/2017, 5:27 PM        Time spent >1 hour

## 2017-06-02 NOTE — Progress Notes (Signed)
*  PRELIMINARY RESULTS* Vascular Ultrasound Carotid Duplex (Doppler) has been completed.  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 Evidence of 40-59% stenosis Left ICA. Antegrade vertebral flow.    Landry Mellow, RDMS, RVT  06/02/2017, 2:30 PM

## 2017-06-02 NOTE — Progress Notes (Signed)
  Echocardiogram 2D Echocardiogram has been performed.  Ricardo Jackson 06/02/2017, 11:02 AM

## 2017-06-02 NOTE — Progress Notes (Signed)
Pt. Bolus is complete denies dizziness or light headiness. Plan to have doppler studies today poss discharge.

## 2017-06-03 ENCOUNTER — Observation Stay (HOSPITAL_COMMUNITY): Payer: Medicare HMO

## 2017-06-03 DIAGNOSIS — E7800 Pure hypercholesterolemia, unspecified: Secondary | ICD-10-CM

## 2017-06-03 LAB — GLUCOSE, CAPILLARY: Glucose-Capillary: 137 mg/dL — ABNORMAL HIGH (ref 65–99)

## 2017-06-03 LAB — COMPREHENSIVE METABOLIC PANEL
ALBUMIN: 3 g/dL — AB (ref 3.5–5.0)
ALK PHOS: 81 U/L (ref 38–126)
ALT: 23 U/L (ref 17–63)
AST: 30 U/L (ref 15–41)
Anion gap: 8 (ref 5–15)
BUN: 22 mg/dL — ABNORMAL HIGH (ref 6–20)
CALCIUM: 9 mg/dL (ref 8.9–10.3)
CO2: 24 mmol/L (ref 22–32)
CREATININE: 1.43 mg/dL — AB (ref 0.61–1.24)
Chloride: 102 mmol/L (ref 101–111)
GFR calc Af Amer: 51 mL/min — ABNORMAL LOW (ref >60)
GFR calc non Af Amer: 44 mL/min — ABNORMAL LOW (ref >60)
GLUCOSE: 108 mg/dL — AB (ref 65–99)
Potassium: 4.1 mmol/L (ref 3.5–5.1)
SODIUM: 134 mmol/L — AB (ref 135–145)
Total Bilirubin: 0.9 mg/dL (ref 0.3–1.2)
Total Protein: 6.2 g/dL — ABNORMAL LOW (ref 6.5–8.1)

## 2017-06-03 NOTE — Progress Notes (Signed)
Pt given discharge instructions, questions answered. IV and tele removed.

## 2017-06-03 NOTE — Discharge Summary (Signed)
Physician Discharge Summary  Ricardo Jackson MRN: [id number] DOB/AGE: Mar 15, 1933 84 y.o.  PCP: Kendrick Ranch, MD   Admit date: 06/01/2017 Discharge date: 06/03/2017  Discharge Diagnoses:    Principal Problem:   Syncope Active Problems:   Hypertension   Hyperlipidemia   CAD (coronary artery disease)   Hyponatremia   Renal insufficiency   ETOH abuse      Follow-up recommendations Follow-up with PCP in 3-5 days , including all  additional recommended appointments as below Follow-up CBC, CMP in 3-5 days Patient recommended to hold chlorthalidone and Diovan until next week, pending improvement in his renal function, and PCP follow-up Patient instructed not to drive back home alone, in the presence of RN. He voiced understanding     Current Discharge Medication List    CONTINUE these medications which have CHANGED   Details  chlorthalidone (HYGROTON) 25 MG tablet Take 1 tablet (25 mg total) by mouth daily. Qty: 30 tablet, Refills: 0    valsartan (DIOVAN) 320 MG tablet Take 1 tablet (320 mg total) by mouth daily. Qty: 30 tablet, Refills: 0      CONTINUE these medications which have NOT CHANGED   Details  aspirin EC 81 MG tablet Take 81 mg by mouth daily.    atorvastatin (LIPITOR) 40 MG tablet Take 40 mg by mouth daily.    BRILINTA 90 MG TABS tablet Take 90 mg by mouth daily.    ferrous sulfate 325 (65 FE) MG tablet Take 325 mg by mouth daily with breakfast.    lansoprazole (PREVACID) 30 MG capsule Take 30 mg by mouth daily.    LEVITRA 20 MG tablet Take 20 mg by mouth daily as needed for erectile dysfunction.     metoprolol tartrate (LOPRESSOR) 25 MG tablet Take 25 mg by mouth daily.    ULORIC 80 MG TABS Take 80 mg by mouth daily.    Vitamin D, Ergocalciferol, (DRISDOL) 50000 units CAPS capsule Take 50,000 Units by mouth every 7 (seven) days. [place]         Discharge Condition: Stable   Discharge Instructions Get Medicines reviewed and  adjusted: Please take all your medications with you for your next visit with your Primary MD  Please request your Primary MD to go over all hospital tests and procedure/radiological results at the follow up, please ask your Primary MD to get all Hospital records sent to his/her office.  If you experience worsening of your admission symptoms, develop shortness of breath, life threatening emergency, suicidal or homicidal thoughts you must seek medical attention immediately by calling 911 or calling your MD immediately if symptoms less severe.  You must read complete instructions/literature along with all the possible adverse reactions/side effects for all the Medicines you take and that have been prescribed to you. Take any new Medicines after you have completely understood and accpet all the possible adverse reactions/side effects.   Do not drive when taking Pain medications.   Do not take more than prescribed Pain, Sleep and Anxiety Medications  Special Instructions: If you have smoked or chewed Tobacco in the last 2 yrs please stop smoking, stop any regular Alcohol and or any Recreational drug use.  Wear Seat belts while driving.  Please note  You were cared for by a hospitalist during your hospital stay. Once you are discharged, your primary care physician will handle any further medical issues. Please note that NO REFILLS for any discharge medications will be authorized once you are discharged, as it is imperative that you  return to your primary care physician (or establish a relationship with a primary care physician if you do not have one) for your aftercare needs so that they can reassess your need for medications and monitor your lab values.     Allergies  Allergen Reactions  . Penicillins Hives    Has patient had a PCN reaction causing immediate rash, facial/tongue/throat swelling, SOB or lightheadedness with hypotension: Yes Has patient had a PCN reaction causing severe rash  involving mucus membranes or skin necrosis: No Has patient had a PCN reaction that required hospitalization: No Has patient had a PCN reaction occurring within the last 10 years: No If all of the above answers are "NO", then may proceed with Cephalosporin use.       Disposition: Home   Consults:  None    Significant Diagnostic Studies:  Dg Chest 2 View  Result Date: 06/02/2017 CLINICAL DATA:  Fever. EXAM: CHEST  2 VIEW COMPARISON:  06/01/2017 FINDINGS: Cardiomediastinal silhouette is normal. Mediastinal contours appear intact. Calcific atherosclerotic disease of the aorta Chronic elevation of the left hemidiaphragm. There is no evidence of focal airspace consolidation, pleural effusion or pneumothorax. Osseous structures are without acute abnormality. Soft tissues are grossly normal. IMPRESSION: No active cardiopulmonary disease. Calcific atherosclerotic disease of the aorta. Electronically Signed   By: Fidela Salisbury M.D.   On: 06/02/2017 20:07   Dg Chest 2 View  Result Date: 06/01/2017 CLINICAL DATA:  Syncope. EXAM: CHEST  2 VIEW COMPARISON:  None. FINDINGS: Normal heart size. Negative atheromatous aortic contours. Coronary stent noted. Mild elevation the left diaphragm. There is no edema, consolidation, effusion, or pneumothorax. Nipple shadow at the right base, also seen laterally. Spondylosis. No acute osseous finding. IMPRESSION: No acute finding. Mild elevation of the left diaphragm. Electronically Signed   By: Monte Fantasia M.D.   On: 06/01/2017 10:49   Ct Head Wo Contrast  Result Date: 06/01/2017 CLINICAL DATA:  Syncope with fall EXAM: CT HEAD WITHOUT CONTRAST TECHNIQUE: Contiguous axial images were obtained from the base of the skull through the vertex without intravenous contrast. COMPARISON:  None. FINDINGS: Brain: There is age related volume loss. There is no intracranial mass, hemorrhage, extra-axial fluid collection, or midline shift. There is evidence of a prior  small infarct in the superior left cerebellum. There is slight small vessel disease in the centra semiovale bilaterally. Elsewhere gray-white compartments appear normal. No acute infarct is demonstrable on this study. Vascular: There is no hyperdense vessel. There is calcification in each carotid siphon region. There is also calcification in each distal vertebral artery. Skull: The bony calvarium appears intact. There is a left frontal scalp hematoma. Sinuses/Orbits: There is mucosal thickening in several ethmoid air cells bilaterally. Orbits appear symmetric bilaterally. Patient appears to have had cataract extractions bilaterally. Other: Mastoid air cells are clear. 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 Minimal periventricular small vessel disease. No intracranial mass, hemorrhage, or extra-axial fluid collection. Left frontal scalp hematoma without fracture. Mild paranasal sinus disease in ethmoid regions. Multiple foci of atherosclerotic vascular calcification. Electronically Signed   By: Lowella Grip III M.D.   On: 06/01/2017 11:48   Dg Hand Complete Right  Result Date: 06/01/2017 CLINICAL DATA:  Right hand pain after fall today. EXAM: RIGHT HAND - COMPLETE 3+ VIEW COMPARISON:  None. FINDINGS: There is no evidence of fracture or dislocation. Probable degenerative changes seen involving the second metacarpal phalangeal joint. Soft tissues are unremarkable. IMPRESSION: No acute abnormality seen in the right hand.  Electronically Signed   By: Marijo Conception, M.D.   On: 06/01/2017 10:50    echocardiogram  LV EF: 50% -   55%  ------------------------------------------------------------------- Indications:      Syncope 780.2.  ------------------------------------------------------------------- History:   PMH:   Coronary artery disease.  Risk factors: Hypertension. Dyslipidemia.  ------------------------------------------------------------------- Study  Conclusions  - Left ventricle: The cavity size was normal. There was mild   concentric hypertrophy. Systolic function was normal. The   estimated ejection fraction was in the range of 50% to 55%. There   is mild hypokinesis of the entireanteroseptal myocardium. Doppler   parameters are consistent with abnormal left ventricular   relaxation (grade 1 diastolic dysfunction). - Mitral valve: There was mild regurgitation. - Left atrium: The atrium was mildly dilated.     Filed Weights   06/01/17 1449 06/02/17 0500 06/03/17 0459  Weight: 64.9 kg (143 lb 1.6 oz) 66 kg (145 lb 8 oz) 64.6 kg (142 lb 8 oz)     Microbiology: No results found for this or any previous visit (from the past 240 hour(s)).     Blood Culture No results found for: SDES, SPECREQUEST, CULT, REPTSTATUS    Labs: Results for orders placed or performed during the hospital encounter of 06/01/17 (from the past 48 hour(s))  Basic metabolic panel     Status: Abnormal   Collection Time: 06/01/17  9:42 AM  Result Value Ref Range   Sodium 130 (L) 135 - 145 mmol/L   Potassium 4.2 3.5 - 5.1 mmol/L   Chloride 99 (L) 101 - 111 mmol/L   CO2 23 22 - 32 mmol/L   Glucose, Bld 133 (H) 65 - 99 mg/dL   BUN 30 (H) 6 - 20 mg/dL   Creatinine, Ser 1.79 (H) 0.61 - 1.24 mg/dL   Calcium 8.8 (L) 8.9 - 10.3 mg/dL   GFR calc non Af Amer 34 (L) >60 mL/min   GFR calc Af Amer 39 (L) >60 mL/min    Comment: (NOTE) The eGFR has been calculated using the CKD EPI equation. This calculation has not been validated in all clinical situations. eGFR's persistently <60 mL/min signify possible Chronic Kidney Disease.    Anion gap 8 5 - 15  CBC     Status: Abnormal   Collection Time: 06/01/17  9:42 AM  Result Value Ref Range   WBC 6.0 4.0 - 10.5 K/uL   RBC 3.72 (L) 4.22 - 5.81 MIL/uL   Hemoglobin 11.3 (L) 13.0 - 17.0 g/dL   HCT 34.6 (L) 39.0 - 52.0 %   MCV 93.0 78.0 - 100.0 fL   MCH 30.4 26.0 - 34.0 pg   MCHC 32.7 30.0 - 36.0 g/dL   RDW  13.1 11.5 - 15.5 %   Platelets 210 150 - 400 K/uL  CBG monitoring, ED     Status: Abnormal   Collection Time: 06/01/17  9:48 AM  Result Value Ref Range   Glucose-Capillary 121 (H) 65 - 99 mg/dL  I-Stat Troponin, ED (not at [facility])     Status: None   Collection Time: 06/01/17  9:49 AM  Result Value Ref Range   Troponin i, poc 0.03 0.00 - 0.08 ng/mL   Comment 3            Comment: Due to the release kinetics of cTnI, a negative result within the first hours of the onset of symptoms does not rule out myocardial infarction with certainty. If myocardial infarction is still suspected, repeat the test at appropriate  intervals.   Urinalysis, Routine w reflex microscopic     Status: None   Collection Time: 06/01/17 11:00 AM  Result Value Ref Range   Color, Urine YELLOW YELLOW   APPearance CLEAR CLEAR   Specific Gravity, Urine 1.012 1.005 - 1.030   pH 5.0 5.0 - 8.0   Glucose, UA NEGATIVE NEGATIVE mg/dL   Hgb urine dipstick NEGATIVE NEGATIVE   Bilirubin Urine NEGATIVE NEGATIVE   Ketones, ur NEGATIVE NEGATIVE mg/dL   Protein, ur NEGATIVE NEGATIVE mg/dL   Nitrite NEGATIVE NEGATIVE   Leukocytes, UA NEGATIVE NEGATIVE  Troponin I     Status: Abnormal   Collection Time: 06/01/17  3:59 PM  Result Value Ref Range   Troponin I 0.03 (HH) <0.03 ng/mL    Comment: CRITICAL RESULT CALLED TO, READ BACK BY AND VERIFIED WITHChristiane Ha RN [id number] GREEN R   Glucose, capillary     Status: Abnormal   Collection Time: 06/01/17  8:54 PM  Result Value Ref Range   Glucose-Capillary 121 (H) 65 - 99 mg/dL   Comment 1 Notify RN    Comment 2 Document in Chart   Basic metabolic panel     Status: Abnormal   Collection Time: 06/02/17  3:40 AM  Result Value Ref Range   Sodium 132 (L) 135 - 145 mmol/L   Potassium 3.9 3.5 - 5.1 mmol/L   Chloride 101 101 - 111 mmol/L   CO2 24 22 - 32 mmol/L   Glucose, Bld 107 (H) 65 - 99 mg/dL   BUN 27 (H) 6 - 20 mg/dL   Creatinine, Ser 1.67 (H) 0.61 - 1.24 mg/dL   Calcium  8.7 (L) 8.9 - 10.3 mg/dL   GFR calc non Af Amer 37 (L) >60 mL/min   GFR calc Af Amer 43 (L) >60 mL/min    Comment: (NOTE) The eGFR has been calculated using the CKD EPI equation. This calculation has not been validated in all clinical situations. eGFR's persistently <60 mL/min signify possible Chronic Kidney Disease.    Anion gap 7 5 - 15  CBC     Status: Abnormal   Collection Time: 06/02/17  3:40 AM  Result Value Ref Range   WBC 5.2 4.0 - 10.5 K/uL   RBC 3.64 (L) 4.22 - 5.81 MIL/uL   Hemoglobin 11.0 (L) 13.0 - 17.0 g/dL   HCT 33.9 (L) 39.0 - 52.0 %   MCV 93.1 78.0 - 100.0 fL   MCH 30.2 26.0 - 34.0 pg   MCHC 32.4 30.0 - 36.0 g/dL   RDW 13.2 11.5 - 15.5 %   Platelets 213 150 - 400 K/uL  Glucose, capillary     Status: Abnormal   Collection Time: 06/02/17  6:04 AM  Result Value Ref Range   Glucose-Capillary 101 (H) 65 - 99 mg/dL   Comment 1 Notify RN    Comment 2 Document in Chart   Glucose, capillary     Status: Abnormal   Collection Time: 06/02/17 11:23 AM  Result Value Ref Range   Glucose-Capillary 122 (H) 65 - 99 mg/dL   Comment 1 Notify RN   Glucose, capillary     Status: Abnormal   Collection Time: 06/02/17  4:25 PM  Result Value Ref Range   Glucose-Capillary 106 (H) 65 - 99 mg/dL   Comment 1 Notify RN   Comprehensive metabolic panel     Status: Abnormal   Collection Time: 06/03/17  3:32 AM  Result Value Ref Range   Sodium 134 (L)  135 - 145 mmol/L   Potassium 4.1 3.5 - 5.1 mmol/L   Chloride 102 101 - 111 mmol/L   CO2 24 22 - 32 mmol/L   Glucose, Bld 108 (H) 65 - 99 mg/dL   BUN 22 (H) 6 - 20 mg/dL   Creatinine, Ser 1.43 (H) 0.61 - 1.24 mg/dL   Calcium 9.0 8.9 - 10.3 mg/dL   Total Protein 6.2 (L) 6.5 - 8.1 g/dL   Albumin 3.0 (L) 3.5 - 5.0 g/dL   AST 30 15 - 41 U/L   ALT 23 17 - 63 U/L   Alkaline Phosphatase 81 38 - 126 U/L   Total Bilirubin 0.9 0.3 - 1.2 mg/dL   GFR calc non Af Amer 44 (L) >60 mL/min   GFR calc Af Amer 51 (L) >60 mL/min    Comment:  (NOTE) The eGFR has been calculated using the CKD EPI equation. This calculation has not been validated in all clinical situations. eGFR's persistently <60 mL/min signify possible Chronic Kidney Disease.    Anion gap 8 5 - 15  Glucose, capillary     Status: Abnormal   Collection Time: 06/03/17  4:43 AM  Result Value Ref Range   Glucose-Capillary 137 (H) 65 - 99 mg/dL   Comment 1 Notify RN    Comment 2 Document in Chart      Lipid Panel  No results found for: CHOL, TRIG, HDL, CHOLHDL, VLDL, LDLCALC, LDLDIRECT   No results found for: HGBA1C   Lab Results  Component Value Date   CREATININE 1.43 (H) 06/03/2017     HPI :  Mohannad Olivero is a 83 y.o. male with medical history significant of hypertension, CAD with a stent.  Mr. Roxy Manns was visiting [place] from [place] to play golf today.  Pt reports he began feeling nauseated and opened car door.  Pt reports he passed out and hit his head.  EMS reported pt passed out again while they were evaluating him.  Pt complains of soreness to his forehead.   ED Course: Pt seen in the ED.  Ct head shows no acute abnormality prior small infarct in superior left cerebellum.  Hand and chest xray negative   HOSPITAL COURSE: Syncope Patient admitted for syncope workup-thought to be vasovagal in etiology Telemetry strip showed sinus rhythm Initial creatinine around 1.79, sodium was found to be 130. 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 Echo results pending  Fever Low grade fever, UA negative Patient denied any focal symptoms, repeat chest x-ray was negative  Hypertensive urgency blood pressure the 180s to 190s Patient was started on prn hydralazine, resolved Continue metoprolol  Resume chlorthalidone and Diovan next week of renal function is stable   Discharge Exam:   Blood pressure 103/71, pulse 100, temperature 98.5 F (36.9 C), temperature source Oral, resp. rate 17, height '5\' 9"'  (1.753 m), weight 64.6 kg (142 lb 8 oz), SpO2 99 %. Cardiovascular: Regular rate and rhythm, no murmurs / rubs / gallops. No extremity edema. 2+ pedal pulses.  No carotid bruits.  Abdomen: no tenderness, no masses palpated. No hepatosplenomegaly. Bowel sounds positive.  Musculoskeletal: no clubbing / cyanosis. No joint deformity upper and lower extremities. Good ROM, no contractures. Normal muscle tone.  Skin: no rashes, lesions, ulcers. No induration forehead swollen area mid forehead,  Neurologic: CN 2-12 grossly intact. Sensation intact, DTR normal. Strength 5/5 in all 4.      Follow-up Information    Vasireddy, Lanetta Inch, MD. Call.   Specialty:  Internal Medicine Why:  Hospital follow-up in 3-5 days Contact information: [place] [address] [phone]           Signed: Reyne Dumas 06/03/2017, 8:17 AM        Time spent >1 hour

## 2017-06-07 LAB — CULTURE, BLOOD (ROUTINE X 2)
Culture: NO GROWTH
Culture: NO GROWTH
SPECIAL REQUESTS: ADEQUATE
Special Requests: ADEQUATE

## 2018-04-15 IMAGING — CR DG CHEST 2V
2 series · 3 of 3 positions shown · non-contrast
Comparison: None.

CLINICAL DATA: Syncope.

EXAM:
CHEST  2 VIEW

[chest pa]
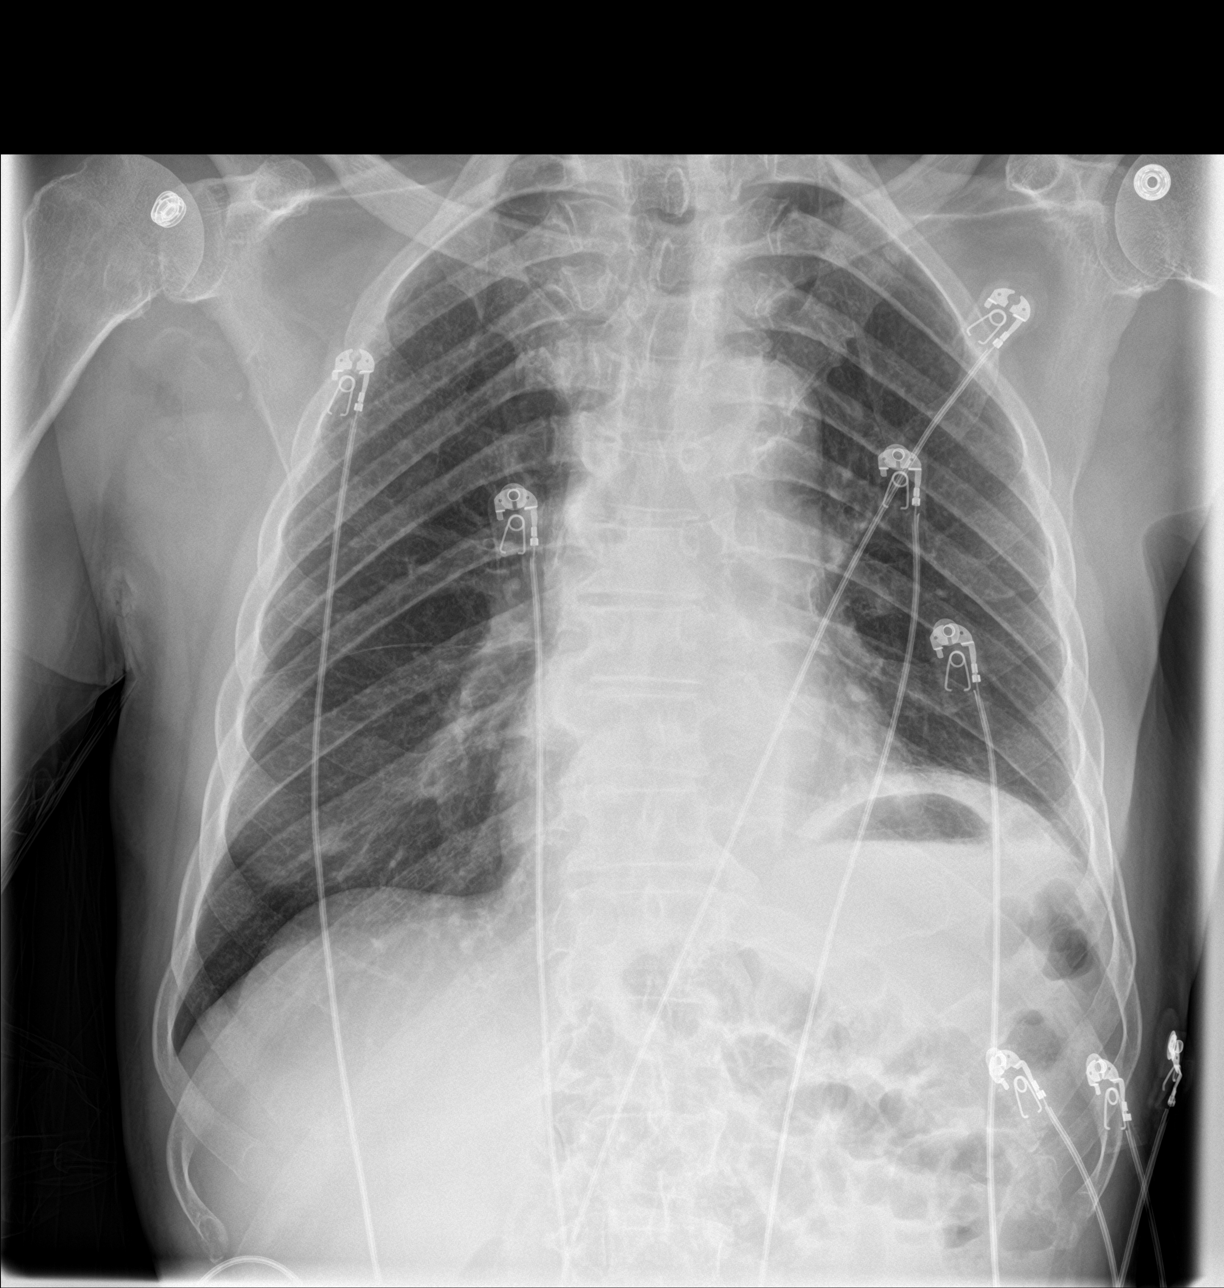

[Series 2: chest lat · 0.14mm/px · 2 of 2 slices shown]
[im 1/2]
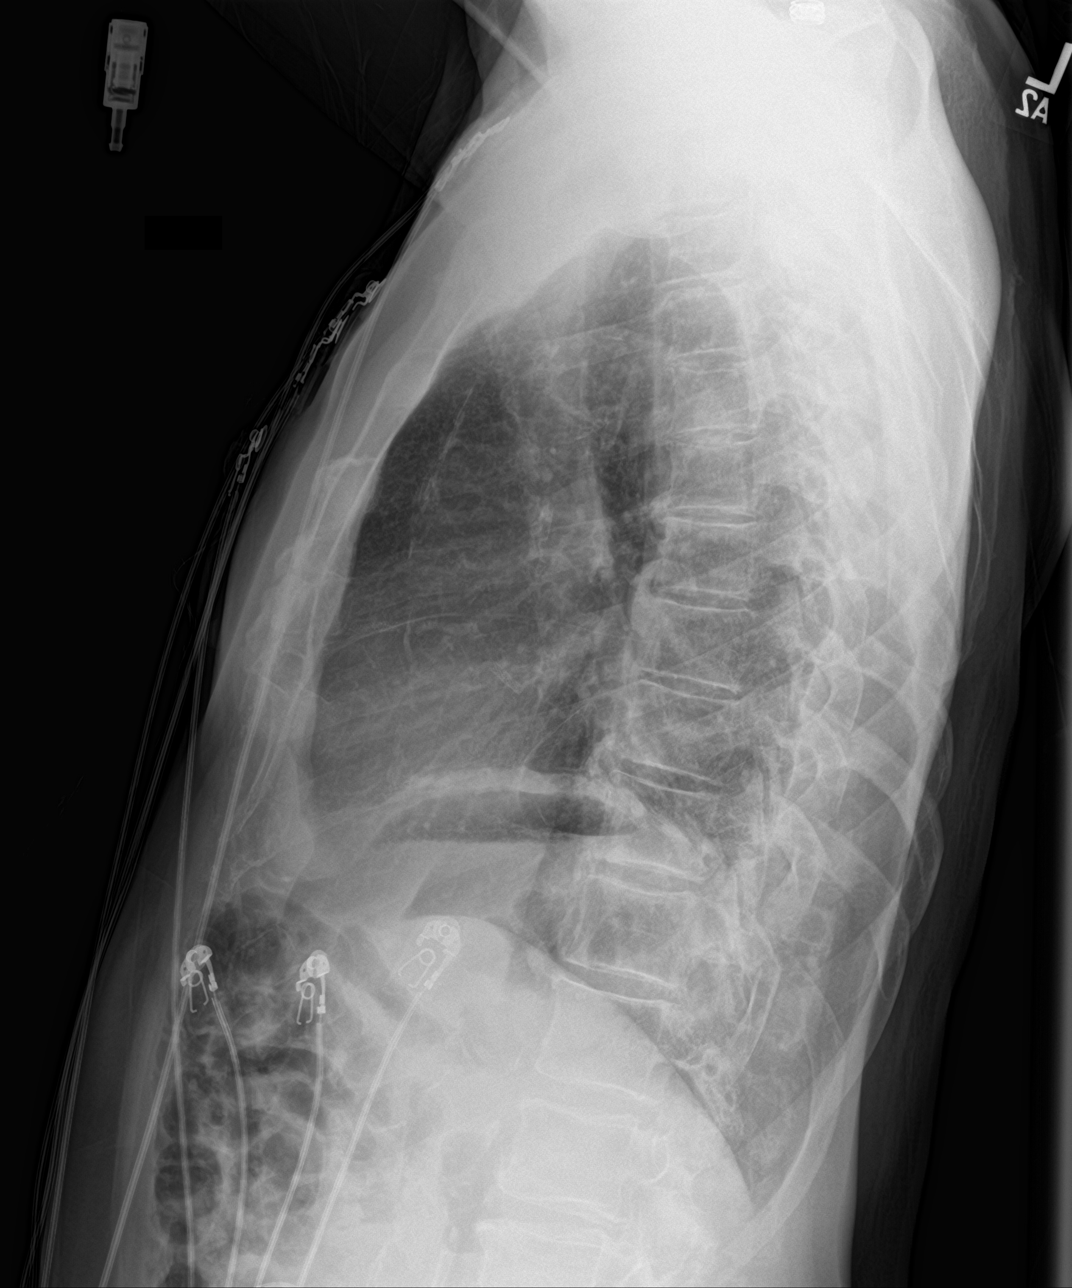
[im 2/2]
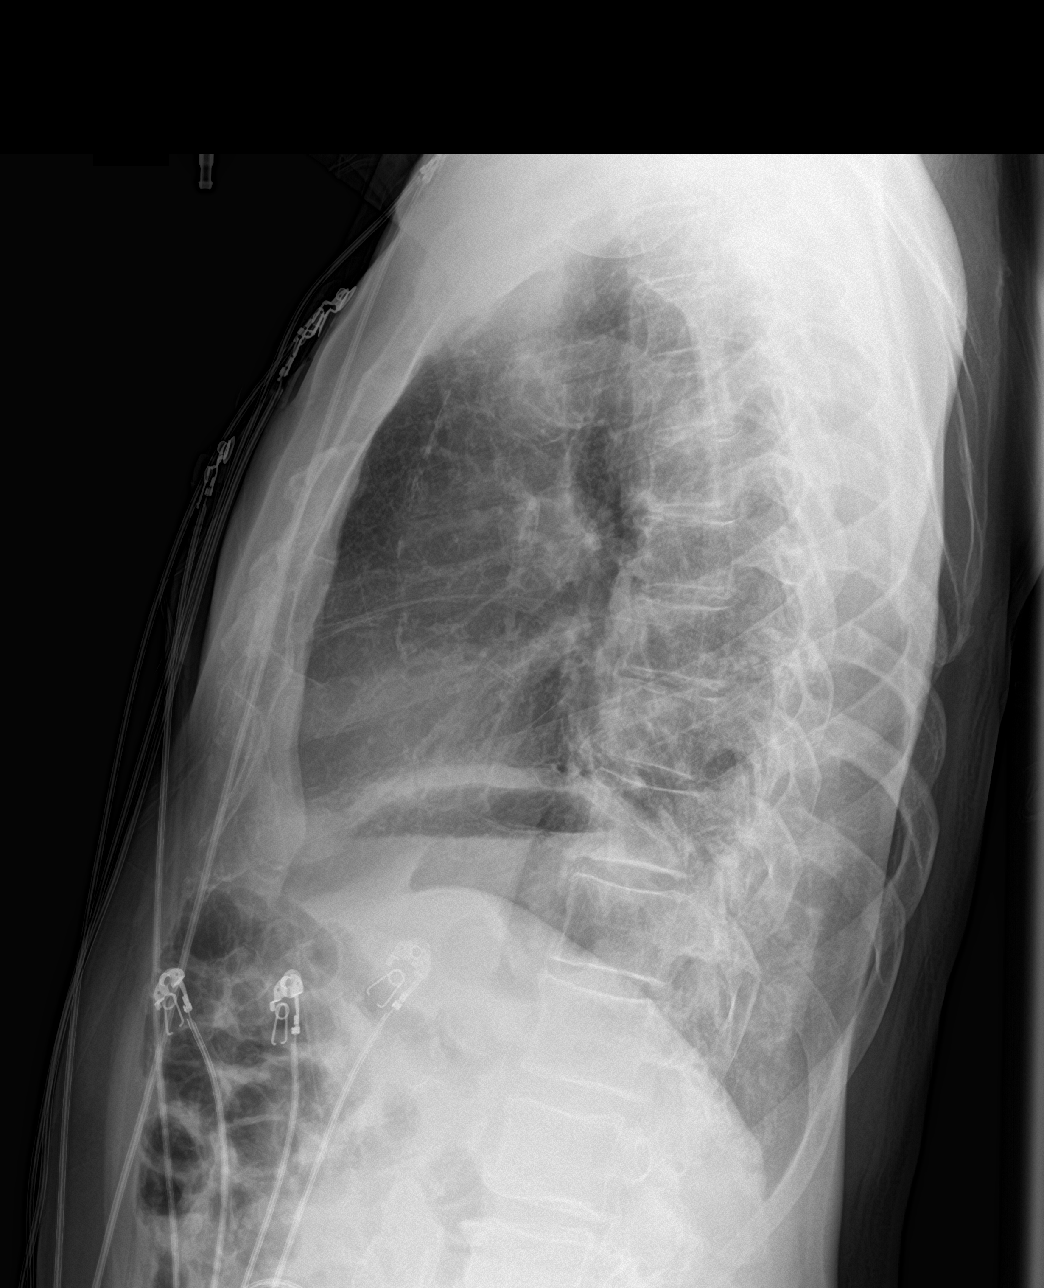

[3 of 3 positions shown; findings below may reference images not displayed]

FINDINGS: Normal heart size. Negative atheromatous aortic contours. Coronary
stent noted. Mild elevation the left diaphragm. There is no edema,
consolidation, effusion, or pneumothorax. Nipple shadow at the right
base, also seen laterally. Spondylosis. No acute osseous finding.
IMPRESSION: No acute finding.

Mild elevation of the left diaphragm.

## 2018-04-16 IMAGING — DX DG CHEST 2V
2 series · 2 of 2 positions shown · non-contrast
Comparison: 06/01/2017

CLINICAL DATA: Fever.

EXAM:
CHEST  2 VIEW

[w chest pa]
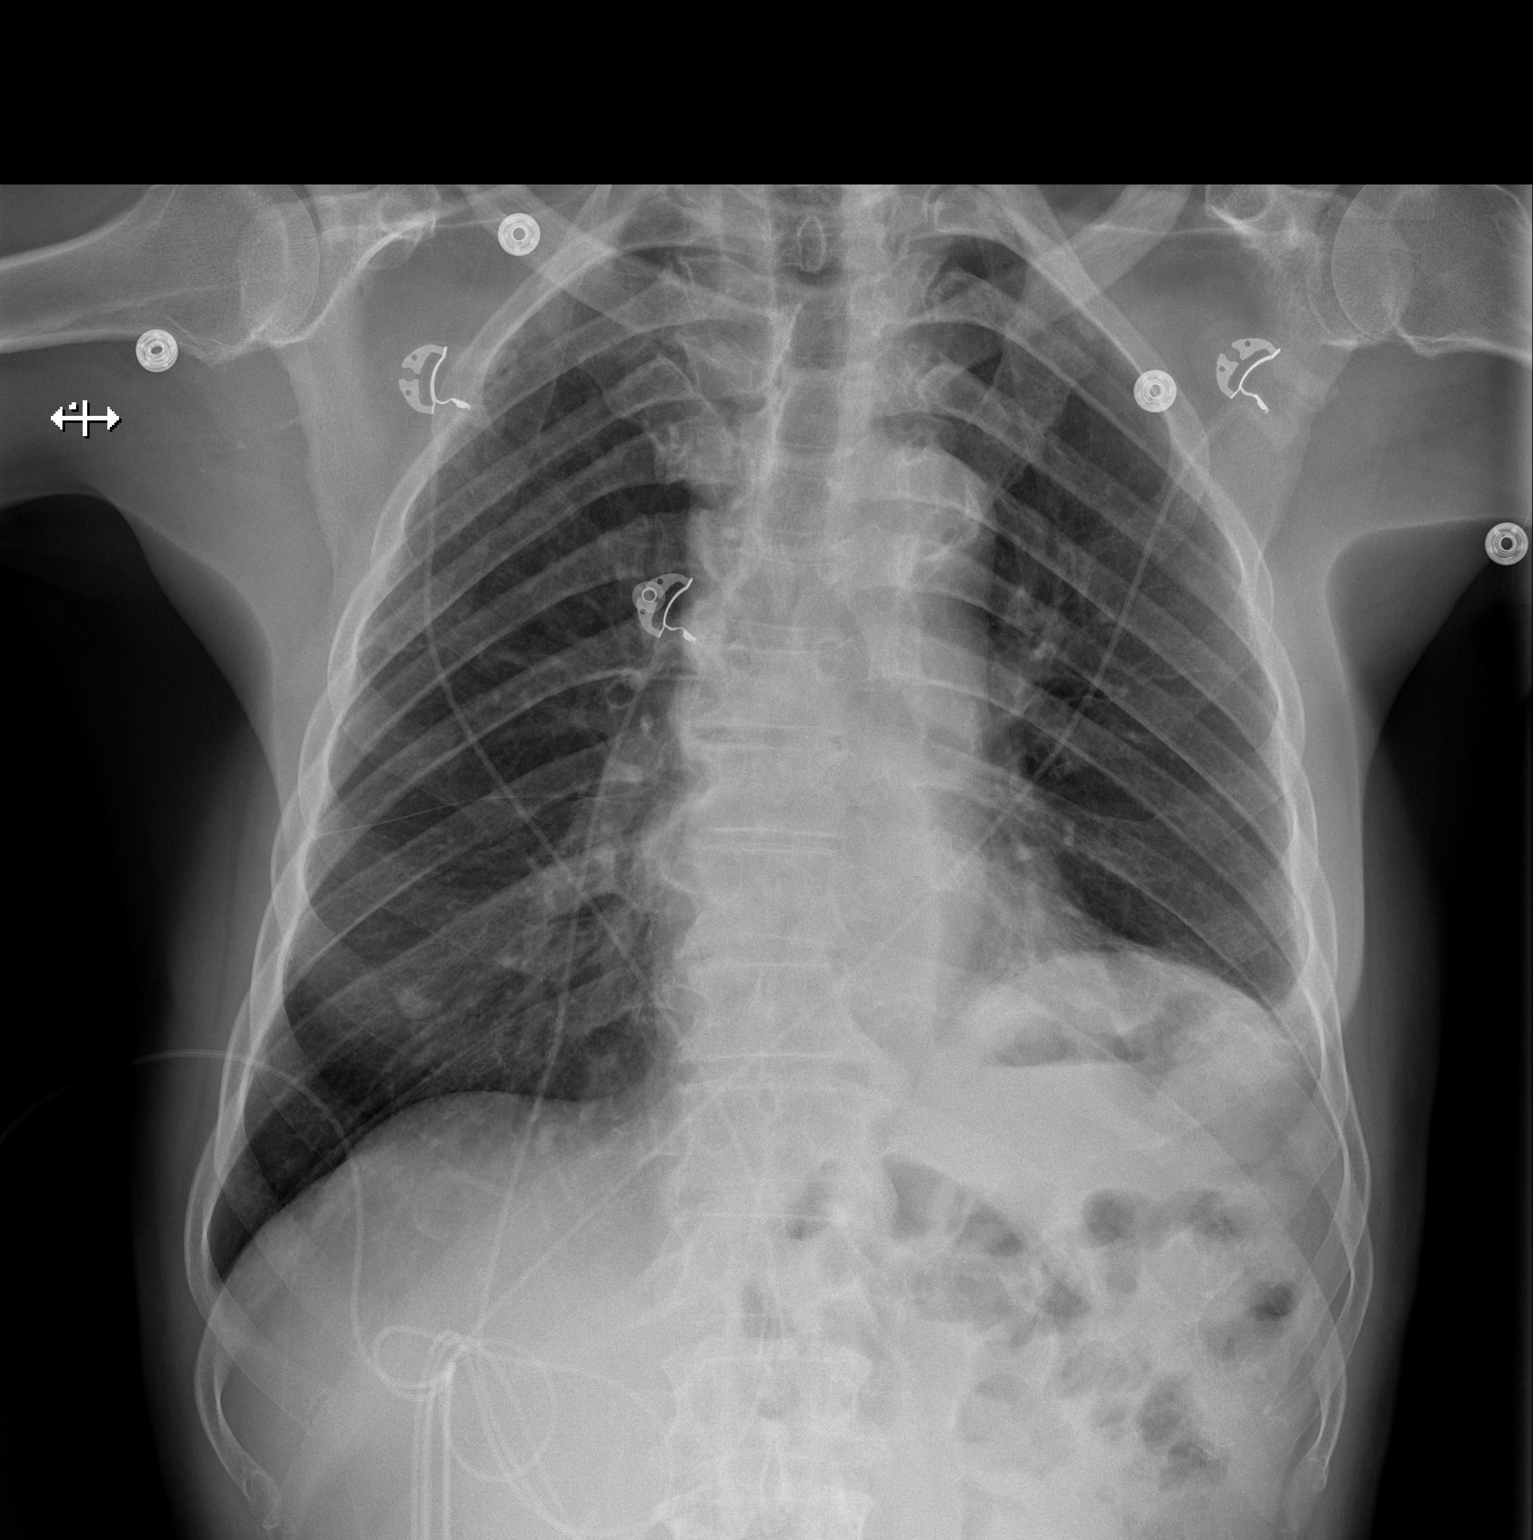

[w chest lat]
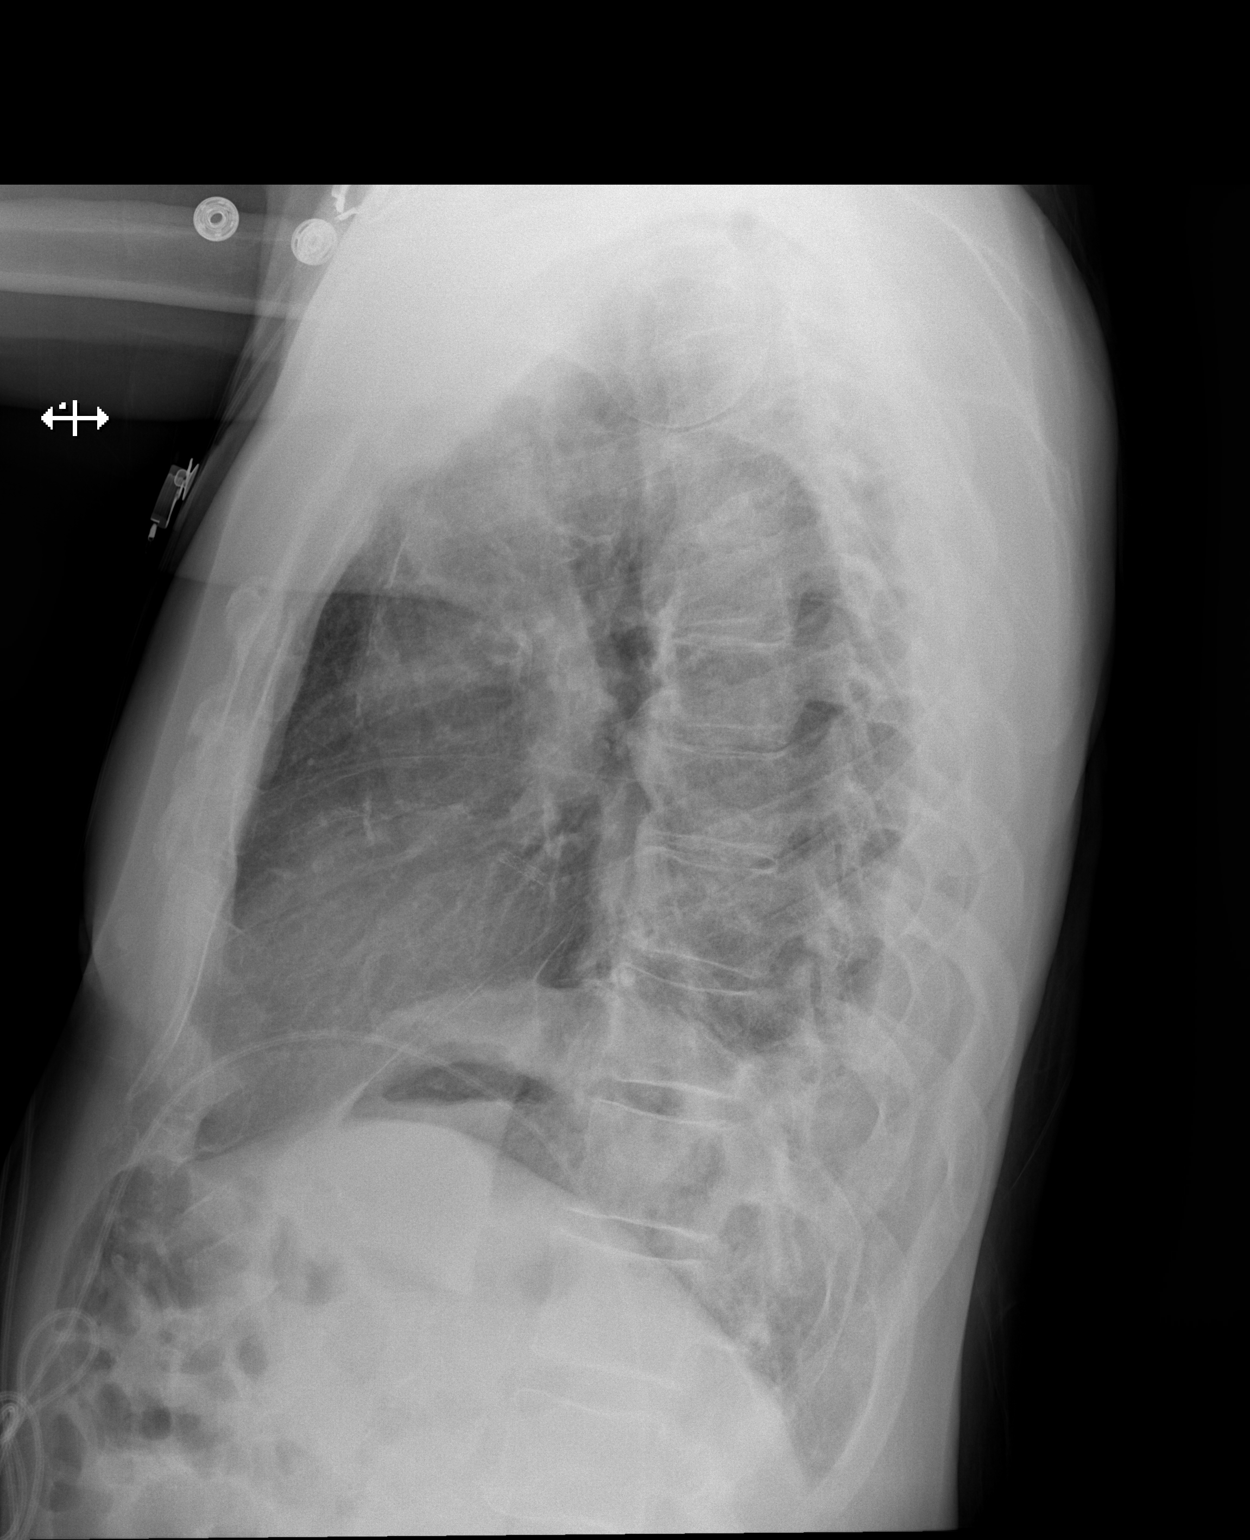

[2 of 2 positions shown; findings below may reference images not displayed]

FINDINGS: Cardiomediastinal silhouette is normal. Mediastinal contours appear
intact. Calcific atherosclerotic disease of the aorta Chronic
elevation of the left hemidiaphragm.

There is no evidence of focal airspace consolidation, pleural
effusion or pneumothorax.

Osseous structures are without acute abnormality. Soft tissues are
grossly normal.
IMPRESSION: No active cardiopulmonary disease.

Calcific atherosclerotic disease of the aorta.

## 2019-07-11 ENCOUNTER — Other Ambulatory Visit: Payer: Self-pay

## 2019-07-11 ENCOUNTER — Encounter: Payer: Self-pay | Admitting: Family Medicine

## 2019-07-11 ENCOUNTER — Ambulatory Visit (INDEPENDENT_AMBULATORY_CARE_PROVIDER_SITE_OTHER): Payer: Medicare HMO | Admitting: Family Medicine

## 2019-07-11 VITALS — BP 136/80 | HR 92 | Temp 97.7°F | Ht 69.0 in | Wt 140.0 lb

## 2019-07-11 DIAGNOSIS — C169 Malignant neoplasm of stomach, unspecified: Secondary | ICD-10-CM | POA: Diagnosis not present

## 2019-07-11 DIAGNOSIS — G5603 Carpal tunnel syndrome, bilateral upper limbs: Secondary | ICD-10-CM

## 2019-07-11 DIAGNOSIS — Z7689 Persons encountering health services in other specified circumstances: Secondary | ICD-10-CM | POA: Diagnosis not present

## 2019-07-11 NOTE — Progress Notes (Signed)
Ricardo Jackson is a 85 y.o. male  Chief Complaint  Patient presents with  . Establish Care    est care/ wants to talk about carpeltunnel in hands.    HPI: Ricardo Jackson is a 85 y.o. male to establish care with our office.  He notes CTS in B/L hands x 1 year. He takes "red and white pill" once per day at night for pain. He saw neuro in [place] (Dr. Fenton Foy [phone]) who diagnosed it. MRI was recommended but initially insurance would not cover but then it was approved, as per pt. He is unsure if EMG was done, thinks it may have been done "a couple of years ago". He would like a referral to a new neurologist here in [place] for eval and to discuss treatment options. He would consider surgery if it would help.  He was treated for gastric cancer in 2004 with xrt/chemo and subtotal gastrectomy. In fall 2019, pt had recurrence of gastric adenocarcinoma but decided not to proceed with surgery or any treatment. He was seen at [facility] but no longer follows there since he declined treatment. He has f/u next week with oncologist in [place].   He has appt in 07/2019 with his PCP in [place] (Dr. Lunette Stands) and he typically does labs every 3 mo.    Past Medical History:  Diagnosis Date  . CAD (coronary artery disease)   . Cancer ([place])   . ETOH abuse   . History of heart artery stent   . Hypertension   . Syncope     Past Surgical History:  Procedure Laterality Date  . BACK SURGERY    . CORONARY STENT PLACEMENT      Social History   Socioeconomic History  . Marital status: Married    Spouse name: Not on file  . Number of children: Not on file  . Years of education: Not on file  . Highest education level: Not on file  Occupational History  . Not on file  Social Needs  . Financial resource strain: Not on file  . Food insecurity    Worry: Not on file    Inability: Not on file  . Transportation needs    Medical: Not on file    Non-medical: Not on file  Tobacco Use  .  Smoking status: Former Research scientist (life sciences)  . Smokeless tobacco: Never Used  Substance and Sexual Activity  . Alcohol use: Yes  . Drug use: Not on file  . Sexual activity: Not on file  Lifestyle  . Physical activity    Days per week: Not on file    Minutes per session: Not on file  . Stress: Not on file  Relationships  . Social Herbalist on phone: Not on file    Gets together: Not on file    Attends religious service: Not on file    Active member of club or organization: Not on file    Attends meetings of clubs or organizations: Not on file    Relationship status: Not on file  . Intimate partner violence    Fear of current or ex partner: Not on file    Emotionally abused: Not on file    Physically abused: Not on file    Forced sexual activity: Not on file  Other Topics Concern  . Not on file  Social History Narrative  . Not on file    Family History  Problem Relation Age of Onset  . Heart disease Father  Immunization History  Administered Date(s) Administered  . Tdap 06/01/2017    Outpatient Encounter Medications as of 07/11/2019  Medication Sig  . atorvastatin (LIPITOR) 40 MG tablet Take 40 mg by mouth daily.  . chlorthalidone (HYGROTON) 25 MG tablet Take 1 tablet (25 mg total) by mouth daily.  . diclofenac sodium (VOLTAREN) 1 % GEL APPLY 1 A SMALL AMOUNT TO AFFECTED AREA THREE TIMES A DAY AS NEEDED  . metoprolol tartrate (LOPRESSOR) 25 MG tablet Take 25 mg by mouth daily.  . pregabalin (LYRICA) 75 MG capsule Take 75 mg by mouth 2 (two) times daily.  . valsartan (DIOVAN) 320 MG tablet Take 1 tablet (320 mg total) by mouth daily.  Marland Kitchen aspirin EC 81 MG tablet Take 81 mg by mouth daily.  Marland Kitchen BRILINTA 90 MG TABS tablet Take 90 mg by mouth daily.  . ferrous sulfate 325 (65 FE) MG tablet Take 325 mg by mouth daily with breakfast.  . lansoprazole (PREVACID) 30 MG capsule Take 30 mg by mouth daily.  Marland Kitchen LEVITRA 20 MG tablet Take 20 mg by mouth daily as needed for erectile  dysfunction.   Marland Kitchen ULORIC 80 MG TABS Take 80 mg by mouth daily.  . Vitamin D, Ergocalciferol, (DRISDOL) 50000 units CAPS capsule Take 50,000 Units by mouth every 7 (seven) days. THURSDAYS   No facility-administered encounter medications on file as of 07/11/2019.      ROS: Pertinent positives and negatives noted in HPI. Remainder of ROS non-contributory  Allergies  Allergen Reactions  . Penicillins Hives    Has patient had a PCN reaction causing immediate rash, facial/tongue/throat swelling, SOB or lightheadedness with hypotension: Yes Has patient had a PCN reaction causing severe rash involving mucus membranes or skin necrosis: No Has patient had a PCN reaction that required hospitalization: No Has patient had a PCN reaction occurring within the last 10 years: No If all of the above answers are "NO", then may proceed with Cephalosporin use.     BP 136/80   Pulse 92   Temp 97.7 F (36.5 C) (Oral)   Ht 5\' 9"  (1.753 m)   Wt 140 lb (63.5 kg)   SpO2 97%   BMI 20.67 kg/m   Physical Exam  Constitutional: He is oriented to person, place, and time. He appears well-developed and well-nourished. No distress.  Musculoskeletal: Normal range of motion.        General: No edema.  Neurological: He is alert and oriented to person, place, and time. He has normal reflexes.  + phalen's, FROM, no edema, good grip strength, no atrophy noted  Psychiatric: He has a normal mood and affect.     A/P:  1. Carpal tunnel syndrome, bilateral - previously seen by Dr. Wendi Snipes in [place], [place] but pt does not want to f/u with him and would like referral to [facility] Neuro for evaluation and to discuss treatment options - Ambulatory referral to Neurology  2. Encounter to establish care with new doctor - pt has PCP in [place] (Dr. Lunette Stands) and has regular f/u next month. Pt seen there q74mo for labs. He plans to continue to be seen there. I explained he cannot have 2 PCPs. He states he called [facility]  to ask about specialist for CTS and he was directed to our office and told he would get referral  3. Gastric adenocarcinoma ([place]) - originally diagnosed in 2004 and treated with xrt and chemo - recurrent in fall 2019 and pt has declined treatment - he states he  likely has "months to live" but "wants the time to be as good as it can be"

## 2019-07-29 ENCOUNTER — Encounter: Payer: Self-pay | Admitting: Neurology

## 2019-09-18 ENCOUNTER — Ambulatory Visit: Payer: Medicare HMO | Admitting: Neurology

## 2019-09-30 ENCOUNTER — Ambulatory Visit: Payer: Medicare HMO | Admitting: Neurology

## 2020-06-18 DEATH — deceased
